# Patient Record
Sex: Male | Born: 1950 | Race: White | Hispanic: No | Marital: Married | Smoking: Never smoker
Health system: Southern US, Community
[De-identification: ages and names within clinical notes are randomized; demographics above are authoritative.]

## PROBLEM LIST (undated history)

## (undated) ENCOUNTER — Emergency Department (HOSPITAL_COMMUNITY): Disposition: A | Discharge status: Home / Self Care | Payer: Medicare Other | Source: Ambulatory Visit

## (undated) DIAGNOSIS — H269 Unspecified cataract: Secondary | ICD-10-CM

## (undated) DIAGNOSIS — R55 Syncope and collapse: Secondary | ICD-10-CM

## (undated) DIAGNOSIS — T7840XA Allergy, unspecified, initial encounter: Secondary | ICD-10-CM

## (undated) DIAGNOSIS — G709 Myoneural disorder, unspecified: Secondary | ICD-10-CM

## (undated) DIAGNOSIS — I10 Essential (primary) hypertension: Secondary | ICD-10-CM

## (undated) DIAGNOSIS — N320 Bladder-neck obstruction: Secondary | ICD-10-CM

## (undated) DIAGNOSIS — E785 Hyperlipidemia, unspecified: Secondary | ICD-10-CM

## (undated) DIAGNOSIS — Z8719 Personal history of other diseases of the digestive system: Secondary | ICD-10-CM

## (undated) DIAGNOSIS — G4730 Sleep apnea, unspecified: Secondary | ICD-10-CM

## (undated) DIAGNOSIS — D569 Thalassemia, unspecified: Secondary | ICD-10-CM

## (undated) DIAGNOSIS — Z8669 Personal history of other diseases of the nervous system and sense organs: Secondary | ICD-10-CM

## (undated) DIAGNOSIS — T8859XA Other complications of anesthesia, initial encounter: Secondary | ICD-10-CM

## (undated) DIAGNOSIS — D563 Thalassemia minor: Secondary | ICD-10-CM

## (undated) HISTORY — DX: Essential (primary) hypertension: I10

## (undated) HISTORY — DX: Hyperlipidemia, unspecified: E78.5

## (undated) HISTORY — DX: Syncope and collapse: R55

## (undated) HISTORY — DX: Unspecified cataract: H26.9

## (undated) HISTORY — DX: Thalassemia minor: D56.3

## (undated) HISTORY — DX: Sleep apnea, unspecified: G47.30

## (undated) HISTORY — DX: Myoneural disorder, unspecified: G70.9

## (undated) HISTORY — PX: UVULOPALATOPHARYNGOPLASTY, TONSILLECTOMY AND SEPTOPLASTY: SHX2632

## (undated) HISTORY — PX: ACHILLES TENDON REPAIR: SUR1153

## (undated) HISTORY — DX: Allergy, unspecified, initial encounter: T78.40XA

## (undated) HISTORY — PX: OTHER SURGICAL HISTORY: SHX169

## (undated) HISTORY — DX: Thalassemia, unspecified: D56.9

---

## 1998-09-07 ENCOUNTER — Ambulatory Visit: Admission: RE | Admit: 1998-09-07 | Discharge: 1998-09-07 | Payer: Self-pay | Admitting: Internal Medicine

## 1999-10-25 ENCOUNTER — Encounter (INDEPENDENT_AMBULATORY_CARE_PROVIDER_SITE_OTHER): Payer: Self-pay | Admitting: *Deleted

## 1999-10-25 ENCOUNTER — Ambulatory Visit (HOSPITAL_BASED_OUTPATIENT_CLINIC_OR_DEPARTMENT_OTHER): Admission: RE | Admit: 1999-10-25 | Discharge: 1999-10-26 | Payer: Self-pay | Admitting: Otolaryngology

## 2002-07-13 ENCOUNTER — Ambulatory Visit (HOSPITAL_COMMUNITY): Admission: RE | Admit: 2002-07-13 | Discharge: 2002-07-13 | Payer: Self-pay | Admitting: Orthopedic Surgery

## 2002-07-13 ENCOUNTER — Encounter: Payer: Self-pay | Admitting: Internal Medicine

## 2004-01-06 ENCOUNTER — Ambulatory Visit: Payer: Self-pay | Admitting: Internal Medicine

## 2004-01-12 ENCOUNTER — Ambulatory Visit: Payer: Self-pay | Admitting: Internal Medicine

## 2004-04-30 ENCOUNTER — Ambulatory Visit: Payer: Self-pay | Admitting: Internal Medicine

## 2005-02-07 ENCOUNTER — Ambulatory Visit: Payer: Self-pay | Admitting: Internal Medicine

## 2005-02-14 ENCOUNTER — Ambulatory Visit: Payer: Self-pay | Admitting: Internal Medicine

## 2006-01-14 ENCOUNTER — Ambulatory Visit: Payer: Self-pay | Admitting: Internal Medicine

## 2006-03-28 ENCOUNTER — Ambulatory Visit: Payer: Self-pay | Admitting: Internal Medicine

## 2006-03-28 LAB — CONVERTED CEMR LAB
ALT: 33 units/L (ref 0–40)
Alkaline Phosphatase: 55 units/L (ref 39–117)
Basophils Absolute: 0 10*3/uL (ref 0.0–0.1)
Basophils Relative: 0.5 % (ref 0.0–1.0)
GFR calc Af Amer: 81 mL/min
GFR calc non Af Amer: 67 mL/min
Glucose, Bld: 98 mg/dL (ref 70–99)
Leukocytes, UA: NEGATIVE
MCV: 63.9 fL — ABNORMAL LOW (ref 78.0–100.0)
Monocytes Absolute: 0.5 10*3/uL (ref 0.2–0.7)
Monocytes Relative: 10.3 % (ref 3.0–11.0)
Nitrite: NEGATIVE
Platelets: 315 10*3/uL (ref 150–400)
Potassium: 4.1 meq/L (ref 3.5–5.1)
RBC: 6.16 M/uL — ABNORMAL HIGH (ref 4.22–5.81)
Sodium: 140 meq/L (ref 135–145)
Specific Gravity, Urine: 1.025 (ref 1.000–1.03)
Total Protein, Urine: NEGATIVE mg/dL
Total Protein: 7.1 g/dL (ref 6.0–8.3)
Urobilinogen, UA: 0.2 (ref 0.0–1.0)
VLDL: 33 mg/dL (ref 0–40)
WBC: 4.7 10*3/uL (ref 4.5–10.5)
pH: 6 (ref 5.0–8.0)

## 2006-04-07 ENCOUNTER — Ambulatory Visit: Payer: Self-pay | Admitting: Internal Medicine

## 2006-04-10 ENCOUNTER — Ambulatory Visit: Payer: Self-pay | Admitting: Internal Medicine

## 2006-04-10 HISTORY — PX: COLONOSCOPY: SHX174

## 2006-07-22 ENCOUNTER — Ambulatory Visit: Payer: Self-pay | Admitting: Internal Medicine

## 2007-04-07 ENCOUNTER — Encounter: Payer: Self-pay | Admitting: Internal Medicine

## 2007-04-07 DIAGNOSIS — I10 Essential (primary) hypertension: Secondary | ICD-10-CM | POA: Insufficient documentation

## 2007-04-07 DIAGNOSIS — E785 Hyperlipidemia, unspecified: Secondary | ICD-10-CM

## 2007-06-09 ENCOUNTER — Ambulatory Visit: Payer: Self-pay | Admitting: Internal Medicine

## 2007-06-09 LAB — CONVERTED CEMR LAB
ALT: 31 units/L (ref 0–53)
AST: 23 units/L (ref 0–37)
Basophils Relative: 0.8 % (ref 0.0–1.0)
CO2: 31 meq/L (ref 19–32)
Calcium: 9.6 mg/dL (ref 8.4–10.5)
Chloride: 103 meq/L (ref 96–112)
Creatinine, Ser: 1.1 mg/dL (ref 0.4–1.5)
Eosinophils Relative: 1.9 % (ref 0.0–5.0)
Glucose, Bld: 104 mg/dL — ABNORMAL HIGH (ref 70–99)
Hemoglobin, Urine: NEGATIVE
Hemoglobin: 12 g/dL — ABNORMAL LOW (ref 13.0–17.0)
Ketones, ur: NEGATIVE mg/dL
Leukocytes, UA: NEGATIVE
Lymphocytes Relative: 25 % (ref 12.0–46.0)
Monocytes Relative: 6.9 % (ref 3.0–12.0)
Neutro Abs: 3.8 10*3/uL (ref 1.4–7.7)
Neutrophils Relative %: 65.4 % (ref 43.0–77.0)
Nitrite: NEGATIVE
PSA: 0.79 ng/mL (ref 0.10–4.00)
RBC: 6.18 M/uL — ABNORMAL HIGH (ref 4.22–5.81)
Specific Gravity, Urine: 1.015 (ref 1.000–1.03)
TSH: 2.17 microintl units/mL (ref 0.35–5.50)
Total Bilirubin: 1.3 mg/dL — ABNORMAL HIGH (ref 0.3–1.2)
Total CHOL/HDL Ratio: 4.9
Total Protein: 7.5 g/dL (ref 6.0–8.3)
Triglycerides: 119 mg/dL (ref 0–149)
Urobilinogen, UA: 0.2 (ref 0.0–1.0)
WBC: 5.8 10*3/uL (ref 4.5–10.5)
pH: 6.5 (ref 5.0–8.0)

## 2007-06-18 ENCOUNTER — Telehealth: Payer: Self-pay | Admitting: Internal Medicine

## 2007-06-18 ENCOUNTER — Ambulatory Visit: Payer: Self-pay | Admitting: Internal Medicine

## 2007-09-15 ENCOUNTER — Telehealth: Payer: Self-pay | Admitting: Internal Medicine

## 2007-09-17 ENCOUNTER — Encounter: Payer: Self-pay | Admitting: Internal Medicine

## 2007-09-25 ENCOUNTER — Ambulatory Visit: Payer: Self-pay | Admitting: Internal Medicine

## 2007-09-25 LAB — CONVERTED CEMR LAB
ALT: 26 units/L (ref 0–53)
Alkaline Phosphatase: 52 units/L (ref 39–117)
Bilirubin, Direct: 0.1 mg/dL (ref 0.0–0.3)
HDL: 36.5 mg/dL — ABNORMAL LOW (ref >39.0)
Total Bilirubin: 1.3 mg/dL — ABNORMAL HIGH (ref 0.3–1.2)
Total Protein: 7.1 g/dL (ref 6.0–8.3)
VLDL: 47 mg/dL — ABNORMAL HIGH (ref 0–40)

## 2008-07-05 ENCOUNTER — Ambulatory Visit: Payer: Self-pay | Admitting: Internal Medicine

## 2008-07-05 LAB — CONVERTED CEMR LAB
Alkaline Phosphatase: 50 units/L (ref 39–117)
BUN: 18 mg/dL (ref 6–23)
Basophils Relative: 0.1 % (ref 0.0–3.0)
Bilirubin, Direct: 0.2 mg/dL (ref 0.0–0.3)
CO2: 29 meq/L (ref 19–32)
Direct LDL: 193.6 mg/dL
Eosinophils Absolute: 0.1 10*3/uL (ref 0.0–0.7)
GFR calc non Af Amer: 81.78 mL/min (ref >60)
Glucose, Bld: 90 mg/dL (ref 70–99)
HDL: 40.6 mg/dL (ref >39.00)
Hemoglobin: 12 g/dL — ABNORMAL LOW (ref 13.0–17.0)
Lymphocytes Relative: 27.5 % (ref 12.0–46.0)
MCHC: 32.3 g/dL (ref 30.0–36.0)
Monocytes Relative: 9.5 % (ref 3.0–12.0)
Neutrophils Relative %: 60.1 % (ref 43.0–77.0)
Nitrite: NEGATIVE
PSA: 1.33 ng/mL (ref 0.10–4.00)
Potassium: 4.2 meq/L (ref 3.5–5.1)
RBC: 5.78 M/uL (ref 4.22–5.81)
Sodium: 144 meq/L (ref 135–145)
Specific Gravity, Urine: 1.02 (ref 1.000–1.030)
Total Bilirubin: 1.4 mg/dL — ABNORMAL HIGH (ref 0.3–1.2)
Total Protein, Urine: NEGATIVE mg/dL
Urine Glucose: NEGATIVE mg/dL
VLDL: 34 mg/dL (ref 0.0–40.0)
WBC: 5.1 10*3/uL (ref 4.5–10.5)
pH: 6 (ref 5.0–8.0)

## 2008-07-11 ENCOUNTER — Ambulatory Visit: Payer: Self-pay | Admitting: Internal Medicine

## 2008-07-11 DIAGNOSIS — M26609 Unspecified temporomandibular joint disorder, unspecified side: Secondary | ICD-10-CM | POA: Insufficient documentation

## 2008-07-11 DIAGNOSIS — R3919 Other difficulties with micturition: Secondary | ICD-10-CM

## 2008-07-11 DIAGNOSIS — D485 Neoplasm of uncertain behavior of skin: Secondary | ICD-10-CM

## 2009-01-05 ENCOUNTER — Ambulatory Visit: Payer: Self-pay | Admitting: Internal Medicine

## 2009-06-22 ENCOUNTER — Telehealth: Payer: Self-pay | Admitting: Internal Medicine

## 2009-07-06 ENCOUNTER — Ambulatory Visit: Payer: Self-pay | Admitting: Internal Medicine

## 2009-07-06 DIAGNOSIS — M67919 Unspecified disorder of synovium and tendon, unspecified shoulder: Secondary | ICD-10-CM | POA: Insufficient documentation

## 2009-07-06 DIAGNOSIS — M719 Bursopathy, unspecified: Secondary | ICD-10-CM

## 2009-08-02 ENCOUNTER — Ambulatory Visit: Payer: Self-pay | Admitting: Internal Medicine

## 2009-08-02 LAB — CONVERTED CEMR LAB
ALT: 30 units/L (ref 0–53)
AST: 22 units/L (ref 0–37)
Albumin: 4.6 g/dL (ref 3.5–5.2)
Alkaline Phosphatase: 50 units/L (ref 39–117)
Basophils Relative: 0.6 % (ref 0.0–3.0)
Bilirubin Urine: NEGATIVE
Bilirubin, Direct: 0.1 mg/dL (ref 0.0–0.3)
Chloride: 105 meq/L (ref 96–112)
Creatinine, Ser: 1 mg/dL (ref 0.4–1.5)
Eosinophils Absolute: 0.2 10*3/uL (ref 0.0–0.7)
Eosinophils Relative: 3 % (ref 0.0–5.0)
GFR calc non Af Amer: 79.63 mL/min (ref >60)
Hemoglobin, Urine: NEGATIVE
Hemoglobin: 11.4 g/dL — ABNORMAL LOW (ref 13.0–17.0)
Ketones, ur: NEGATIVE mg/dL
Lymphocytes Relative: 27.7 % (ref 12.0–46.0)
MCHC: 32.4 g/dL (ref 30.0–36.0)
Monocytes Relative: 7.6 % (ref 3.0–12.0)
Neutro Abs: 3.4 10*3/uL (ref 1.4–7.7)
Neutrophils Relative %: 61.1 % (ref 43.0–77.0)
Nitrite: NEGATIVE
RBC: 5.57 M/uL (ref 4.22–5.81)
Sodium: 140 meq/L (ref 135–145)
Total Protein, Urine: NEGATIVE mg/dL
Total Protein: 7.1 g/dL (ref 6.0–8.3)
Urine Glucose: NEGATIVE mg/dL
VLDL: 37.8 mg/dL (ref 0.0–40.0)
WBC: 5.6 10*3/uL (ref 4.5–10.5)
pH: 5.5 (ref 5.0–8.0)

## 2009-08-09 ENCOUNTER — Ambulatory Visit: Payer: Self-pay | Admitting: Internal Medicine

## 2009-08-10 ENCOUNTER — Telehealth: Payer: Self-pay | Admitting: Internal Medicine

## 2010-04-10 NOTE — Assessment & Plan Note (Signed)
Summary: per pt via email to men,steriod injection shoulder/cd   Vital Signs:  Patient profile:   60 year old male Height:      71 inches (180.34 cm) Weight:      196.50 pounds (89.32 kg) BMI:     27.51 O2 Sat:      97 % on Room air Temp:     98.6 degrees F (37.00 degrees C) oral Pulse rate:   77 / minute Pulse rhythm:   regular BP sitting:   134 / 78  (left arm) Cuff size:   regular  Vitals Entered By: Brenton Grills (July 06, 2009 4:01 PM)  O2 Flow:  Room air CC: pt here for steroid injection in right shoulder/pt states he has been taking advil for shoulder pain with no relief/aj   Primary Care Provider:  Norins   CC:  pt here for steroid injection in right shoulder/pt states he has been taking advil for shoulder pain with no relief/aj.  History of Present Illness: Patient presents for recurrent right shoulder pain. He was seen several months ago for a similar problem and did well with depomedrol injection. He reports that the pain is similar. He does have good range of motion but is becoming limited in his activities due to discomfort.   Current Medications (verified): 1)  Enalapril Maleate 10 Mg  Tabs (Enalapril Maleate) .... Take 1 By Mouth Qd 2)  Adult Aspirin Ec Low Strength 81 Mg  Tbec (Aspirin) .... Take1 By Mouth Qd 3)  Multivitamins   Tabs (Multiple Vitamin) .Marland Kitchen.. 1 Tab Daily 4)  Coq10 100 Mg  Caps (Coenzyme Q10) .Marland Kitchen.. 1 Tab Daily 5)  Fish Oil 1000 Mg  Caps (Omega-3 Fatty Acids) .Marland Kitchen.. 1 Tablet Daily 6)  Vitamin C 1000 Mg  Tabs (Ascorbic Acid) .Marland Kitchen.. 1 Tablet Daily 7)  Vitamin B-12 Cr 1000 Mcg  Tbcr (Cyanocobalamin) .Marland Kitchen.. 1 Tablet Daily 8)  Calcium-Magnesium 500-250 Mg Tabs (Calcium-Magnesium) .... Take 1 Tablet By Mouth Once A Day 9)  Glucosamine-Chondroitin   Caps (Glucosamine-Chondroit-Vit C-Mn) .... Take 1 Tablet By Mouth Once A Day 10)  Zolpidem Tartrate 10 Mg Tabs (Zolpidem Tartrate) .Marland Kitchen.. 1 By Mouth At Bedtime As Needed 11)  Desonide 0.05 % Lotn (Desonide) ....  Apply Three Times A Day As Needed To Poison Ivey  Allergies (verified): 1)  ! Septra PMH-FH-SH reviewed-no changes except otherwise noted  Review of Systems  The patient denies anorexia, weight loss, hoarseness, syncope, prolonged cough, abdominal pain, difficulty walking, and enlarged lymph nodes.    Physical Exam  General:  WNWD white male in no distress Msk:  right should without deformity. Normal active and passive ROM. No crepitis in the joint, no click.   Impression & Recommendations:  Problem # 1:  BURSITIS, RIGHT SHOULDER (ICD-726.10)  Typical presentation. No evidence of joint derangement.  Plan - steroid injection - done with good tolerance and relief of symptoms.  Orders: Joint Aspirate / Injection, Large (20610) Depo- Medrol 40mg  (J1030)  Complete Medication List: 1)  Enalapril Maleate 10 Mg Tabs (Enalapril maleate) .... Take 1 by mouth qd 2)  Adult Aspirin Ec Low Strength 81 Mg Tbec (Aspirin) .... Take1 by mouth qd 3)  Multivitamins Tabs (Multiple vitamin) .Marland Kitchen.. 1 tab daily 4)  Coq10 100 Mg Caps (Coenzyme q10) .Marland Kitchen.. 1 tab daily 5)  Fish Oil 1000 Mg Caps (Omega-3 fatty acids) .Marland Kitchen.. 1 tablet daily 6)  Vitamin C 1000 Mg Tabs (Ascorbic acid) .Marland Kitchen.. 1 tablet daily 7)  Vitamin B-12 Cr  1000 Mcg Tbcr (Cyanocobalamin) .Marland Kitchen.. 1 tablet daily 8)  Calcium-magnesium 500-250 Mg Tabs (Calcium-magnesium) .... Take 1 tablet by mouth once a day 9)  Glucosamine-chondroitin Caps (Glucosamine-chondroit-vit c-mn) .... Take 1 tablet by mouth once a day 10)  Zolpidem Tartrate 10 Mg Tabs (Zolpidem tartrate) .Marland Kitchen.. 1 by mouth at bedtime as needed 11)  Desonide 0.05 % Lotn (Desonide) .... Apply three times a day as needed to poison ivey   Procedure Note Last Tetanus: Td (05/19/2001)  Injections: The patient complains of pain. Indication: acute pain  Procedure # 1: joint injection    Location: right shoulder    Technique: #23g 1" needle    Medication: 40 mg depomedrol    Anesthesia: 2%  xylocain w/ epi    Comment: verbal constnt obtained from the patient. Lateral approach to the shoulder joint. Entered joint space on the first pass. Patient with relief within 2-3 mintues.   Cleaned and prepped with: betadine Additional Instructions: routine post-injection instructions

## 2010-04-10 NOTE — Assessment & Plan Note (Signed)
Summary: cpx / [facility] / # cd   Vital Signs:  Patient profile:   60 year old male Height:      71 inches Weight:      191 pounds BMI:     26.74 O2 Sat:      99 % on Room air Temp:     98.3 degrees F oral Pulse rate:   72 / minute BP sitting:   112 / 74  (left arm) Cuff size:   regular  Vitals Entered By: Ami Bullins CMA (August 09, 2009 11:04 AM)  O2 Flow:  Room air CC: pt here for cpx, he is requesting a 90 day supply of Zolpidem/ ab  Vision Screening:      Vision Comments: Last Eye Exam about 2 years ago   Primary Care Provider:  Rushton Early   CC:  pt here for cpx and he is requesting a 90 day supply of Zolpidem/ ab.  History of Present Illness: Right shoulder doing OK since steroid injection.  He will have mild dizziness/light-headed feeling especially with position change. Onset of symptom was several days ago with no precipitating factors. No focal neurologic signs. Episodes are transient. No falls or limitations in activity.  Check moles on flank.  Generally doing well without other complaints. He did have a LipoProfile with high count small LDL-P - provided patient with copy of report as well as chpt from up-to-date on lipid indicators. He does have a very high total cholesterol and LDL. He has tried statin drugs in the past with complications of muscle pain. He prefers to not take medication.   Current Medications (verified): 1)  Enalapril Maleate 10 Mg  Tabs (Enalapril Maleate) .... Take 1 By Mouth Qd 2)  Adult Aspirin Ec Low Strength 81 Mg  Tbec (Aspirin) .... Take1 By Mouth Qd 3)  Multivitamins   Tabs (Multiple Vitamin) .Marland Kitchen.. 1 Tab Daily 4)  Coq10 100 Mg  Caps (Coenzyme Q10) .Marland Kitchen.. 1 Tab Daily 5)  Fish Oil 1000 Mg  Caps (Omega-3 Fatty Acids) .Marland Kitchen.. 1 Tablet Daily 6)  Vitamin C 1000 Mg  Tabs (Ascorbic Acid) .Marland Kitchen.. 1 Tablet Daily 7)  Vitamin B-12 Cr 1000 Mcg  Tbcr (Cyanocobalamin) .Marland Kitchen.. 1 Tablet Daily 8)  Calcium-Magnesium 500-250 Mg Tabs (Calcium-Magnesium) .... Take 1  Tablet By Mouth Once A Day 9)  Glucosamine-Chondroitin   Caps (Glucosamine-Chondroit-Vit C-Mn) .... Take 1 Tablet By Mouth Once A Day 10)  Zolpidem Tartrate 10 Mg Tabs (Zolpidem Tartrate) .Marland Kitchen.. 1 By Mouth At Bedtime As Needed 11)  Desonide 0.05 % Lotn (Desonide) .... Apply Three Times A Day As Needed To Poison Ivey  Allergies (verified): 1)  ! Septra  Past History:  Past Medical History: Last updated: 06/30/2007 Hyperlipidemia Hypertension Thalassemia minor Episode of near syncope with extensive cardiac evalution-nuc stress '01  Past Surgical History: Last updated: June 30, 2007 Septoplasty and submuscosal resection of inferior turbinates with uvulopalatopharynoplasty with tonsillectomy '01 Jearld Fenton)  Family History: Last updated: 06/30/2007 father-deceased @ 75: CAD/MI-fatal, HTN, smoker mother-'22: manic-depressive, physically a rock Neg- colon cancer, DM  Social History: Last updated: 08/09/2009 [employer] business; [employer] married '75 1 son '89; 2 daughters - '81, '84;  work: self-employed. got a great price on a '99 mercedes sl 500 convertible. (June '11)  Risk Factors: Alcohol Use: 1 (07/11/2008) Caffeine Use: 2 coffee (07/11/2008) Diet: healthy (07/11/2008) Exercise: yes (07/11/2008)  Risk Factors: Smoking Status: never (07/11/2008)  Social History: [employer] business; [employer] married '75 1 son '  62; 2 daughters - '81, '84;  work: self-employed. got a great price on a '99 mercedes sl 500 convertible. (June '11)  Review of Systems  The patient denies anorexia, fever, weight loss, weight gain, decreased hearing, hoarseness, chest pain, dyspnea on exertion, peripheral edema, prolonged cough, hemoptysis, abdominal pain, severe indigestion/heartburn, incontinence, muscle weakness, difficulty walking, depression, abnormal bleeding, enlarged lymph nodes, and angioedema.    Physical Exam  General:  WNWD white male in no  distress Head:  Normocephalic and atraumatic without obvious abnormalities. No apparent alopecia or balding. Eyes:  No corneal or conjunctival inflammation noted. EOMI. Perrla. Funduscopic exam benign, without hemorrhages, exudates or papilledema. Vision grossly normal. Ears:  External ear exam shows no significant lesions or deformities.  Otoscopic examination reveals clear canals, tympanic membranes are intact bilaterally without bulging, retraction, inflammation or discharge. Hearing is grossly normal bilaterally. Nose:  no external deformity and no external erythema.   Mouth:  Oral mucosa and oropharynx without lesions or exudates.  Teeth in good repair. Neck:  supple, full ROM, no thyromegaly, and no carotid bruits.   Chest Wall:  no deformities and no tenderness.   Lungs:  Normal respiratory effort, chest expands symmetrically. Lungs are clear to auscultation, no crackles or wheezes. Heart:  Normal rate and regular rhythm. S1 and S2 normal without gallop, murmur, click, rub or other extra sounds. Abdomen:  soft, non-tender, normal bowel sounds, and no hepatomegaly.   Rectal:  No external abnormalities noted. Normal sphincter tone. No rectal masses or tenderness. Prostate:  Prostate gland firm and smooth, no enlargement, nodularity, tenderness, mass, asymmetry or induration. Msk:  normal ROM, no joint swelling, and no joint deformities.   Pulses:  2+ radial and DP pulses Extremities:  No clubbing, cyanosis, edema, or deformity noted with normal full range of motion of all joints.   Neurologic:  alert & oriented X3, cranial nerves II-XII intact, strength normal in all extremities, gait normal, and DTRs symmetrical and normal.   Skin:  turgor normal, color normal, no rashes, and no ulcerations.  Multiple benign appearing moles on the back. Keratotic lesion 1 x 3 cm right axillary line. Ecchymosis medial aspect proximal left UE Cervical Nodes:  no anterior cervical adenopathy and no posterior  cervical adenopathy.   Inguinal Nodes:  no R inguinal adenopathy and no L inguinal adenopathy.   Psych:  Oriented X3, memory intact for recent and remote, normally interactive, good eye contact, and not anxious appearing.     Impression & Recommendations:  Problem # 1:  BURSITIS, RIGHT SHOULDER (ICD-726.10) Good response to cortisone injection - has full use of right shoulder.  Problem # 2:  HYPERTENSION (ICD-401.9)  His updated medication list for this problem includes:    Enalapril Maleate 10 Mg Tabs (Enalapril maleate) .Marland Kitchen... Take 1 by mouth qd  BP today: 112/74 Prior BP: 134/78 (07/06/2009)  Labs Reviewed: K+: 4.7 (08/02/2009) Creat: : 1.0 (08/02/2009)    Great control - will continue present medications.  Problem # 3:  HYPERLIPIDEMIA (ICD-272.4)  Labs Reviewed: SGOT: 22 (08/02/2009)   SGPT: 30 (08/02/2009)   HDL:47.00 (08/02/2009), 40.60 (07/05/2008)  LDL:220.1 (08/02/2009), 131 (06/09/2007)  Chol:306 (08/02/2009), 279 (07/05/2008)  Trig:189.0 (08/02/2009), 170.0 (07/05/2008)  Went over NMR lipoprofile - the high count on small particle LDL may offset the risk of the very high total LDL  Plan - diet managmement           he may need to consider non-statin medical therapy for his very high LDL  Problem # 4:  Preventive Health Care (ICD-V70.0) Unremarkable history except for bursitis. PHysical exam is normal. Lab results at normal except for lipid panel. He is current with colorectal cancer screening and prostate cancer screening.   In summary - a very nice man who appears to be medically stable except for hypeerlipidemia.   Complete Medication List: 1)  Enalapril Maleate 10 Mg Tabs (Enalapril maleate) .... Take 1 by mouth qd 2)  Adult Aspirin Ec Low Strength 81 Mg Tbec (Aspirin) .... Take1 by mouth qd 3)  Multivitamins Tabs (Multiple vitamin) .Marland Kitchen.. 1 tab daily 4)  Coq10 100 Mg Caps (Coenzyme q10) .Marland Kitchen.. 1 tab daily 5)  Fish Oil 1000 Mg Caps (Omega-3 fatty acids) .Marland Kitchen.. 1  tablet daily 6)  Vitamin C 1000 Mg Tabs (Ascorbic acid) .Marland Kitchen.. 1 tablet daily 7)  Vitamin B-12 Cr 1000 Mcg Tbcr (Cyanocobalamin) .Marland Kitchen.. 1 tablet daily 8)  Calcium-magnesium 500-250 Mg Tabs (Calcium-magnesium) .... Take 1 tablet by mouth once a day 9)  Glucosamine-chondroitin Caps (Glucosamine-chondroit-vit c-mn) .... Take 1 tablet by mouth once a day 10)  Zolpidem Tartrate 10 Mg Tabs (Zolpidem tartrate) .Marland Kitchen.. 1 by mouth at bedtime as needed 11)  Desonide 0.05 % Lotn (Desonide) .... Apply three times a day as needed to poison ivey  Patient: Zachary Gallegos Note: All result statuses are Final unless otherwise noted.  Tests: (1) BMP (METABOL)   Sodium                    140 mEq/L                   135-145   Potassium                 4.7 mEq/L                   3.5-5.1   Chloride                  105 mEq/L                   96-112   Carbon Dioxide            28 mEq/L                    19-32   Glucose                   83 mg/dL                    04-54   BUN                  [H]  24 mg/dL                    0-98   Creatinine                1.0 mg/dL                   1.1-9.1   Calcium                   9.6 mg/dL                   4.7-82.9   GFR                       79.63 mL/min                >  60  Tests: (2) Lipid Panel (LIPID)   Cholesterol          [H]  306 mg/dL                   1-610     ATP III Classification            Desirable:  < 200 mg/dL                    Borderline High:  200 - 239 mg/dL               High:  > = 240 mg/dL   Triglycerides        [H]  189.0 mg/dL                 9.6-045.4     Normal:  <150 mg/dL     Borderline High:  098 - 199 mg/dL   HDL                       11.91 mg/dL                 >47.82   VLDL Cholesterol          37.8 mg/dL                  9.5-62.1  CHO/HDL Ratio:  CHD Risk                             7                    Men          Women     1/2 Average Risk     3.4          3.3     Average Risk          5.0          4.4     2X Average Risk           9.6          7.1     3X Average Risk          15.0          11.0                           Tests: (3) CBC Platelet w/Diff (CBCD)   White Cell Count          5.6 K/uL                    4.5-10.5   Red Cell Count            5.57 Mil/uL                 4.22-5.81   Hemoglobin           [L]  11.4 g/dL                   30.8-65.7   Hematocrit           [L]  35.4 %                      39.0-52.0   MCV                  [  L]  63.5 fl                     78.0-100.0     Rechecked and verified result.   MCHC                      32.4 g/dL                   04.5-40.9   RDW                  [H]  15.9 %                      11.5-14.6   Platelet Count            209.0 K/uL                  150.0-400.0   Neutrophil %              61.1 %                      43.0-77.0   Lymphocyte %              27.7 %                      12.0-46.0   Monocyte %                7.6 %                       3.0-12.0   Eosinophils%              3.0 %                       0.0-5.0   Basophils %               0.6 %                       0.0-3.0   Neutrophill Absolute      3.4 K/uL                    1.4-7.7   Lymphocyte Absolute       1.5 K/uL                    0.7-4.0   Monocyte Absolute         0.4 K/uL                    0.1-1.0  Eosinophils, Absolute                             0.2 K/uL                    0.0-0.7   Basophils Absolute        0.0 K/uL                    0.0-0.1  Tests: (4) Hepatic/Liver Function Panel (HEPATIC)   Total Bilirubin           0.9 mg/dL                   8.1-1.9   Direct Bilirubin  0.1 mg/dL                   0.4-5.4   Alkaline Phosphatase      50 U/L                      39-117   AST                       22 U/L                      0-37   ALT                       30 U/L                      0-53   Total Protein             7.1 g/dL                    0.9-8.1   Albumin                   4.6 g/dL                    1.9-1.4  Tests: (5) TSH (TSH)   FastTSH                    1.31 uIU/mL                 0.35-5.50  Tests: (6) Prostate Specific Antigen (PSA)   PSA-Hyb                   1.12 ng/mL                  0.10-4.00  Tests: (7) UDip Only (UDIP)   Color                     Yellow       RANGE:  Yellow;Lt. Yellow   Clarity                   CLEAR                       Clear   Specific Gravity          1.020                       1.000 - 1.030   Urine Ph                  5.5                         5.0-8.0   Protein                   NEGATIVE                    Negative   Urine Glucose             NEGATIVE                    Negative   Ketones                   NEGATIVE  Negative   Urine Bilirubin           NEGATIVE                    Negative   Blood                     NEGATIVE                    Negative   Urobilinogen              0.2                         0.0 - 1.0   Leukocyte Esterace        NEGATIVE                    Negative   Nitrite                   NEGATIVE                    Negative  Tests: (8) Cholesterol LDL - Direct (DIRLDL)  Cholesterol LDL - Direct                             220.3 mg/dL     Optimal:  <644 mg/dL     Near or Above Optimal:  100-129 mg/dL     Borderline High:  034-742 mg/dL     High:  595-638 mg/dL     Very High:  >756 mg/dL

## 2010-04-10 NOTE — Progress Notes (Signed)
Summary: Refill request  Phone Note Refill Request Message from:  Patient on August 10, 2009 2:57 PM  Refills Requested: Medication #1:  ZOLPIDEM TARTRATE 10 MG TABS 1 by mouth at bedtime as needed   Dosage confirmed as above?Dosage Confirmed Please advise refill? 90 day supply sent to [facility] on [address].   Initial call taken by: Josph Macho RMA,  August 10, 2009 2:58 PM  Follow-up for Phone Call        ok for refill x 90 days Follow-up by: Jacques Navy MD,  August 10, 2009 4:48 PM    Prescriptions: ZOLPIDEM TARTRATE 10 MG TABS (ZOLPIDEM TARTRATE) 1 by mouth at bedtime as needed  #90 x 1   Entered and Authorized by:   Josph Macho RMA   Signed by:   Josph Macho RMA on 08/11/2009   Method used:   Telephoned to ...       [facility]  [address]  [phone]* (retail)       [address]       [place], [place]  [address]       Ph: [phone] or [phone]       Fax: [phone]   RxID:   [id number]

## 2010-04-10 NOTE — Progress Notes (Signed)
  Phone Note Call from Patient Call back at Home Phone [phone]   Caller: Patient Call For: Jacques Navy MD Summary of Call: Pt sched CPX for 5/24 with Dr Debby Bud. Pt will be out of bp medicine in 1 wk and is requesting a 90 day supply be sent to [facility] on [address], [place] and it will only cost $10. Please advise and let pt know if prescription is sent to pharmacy. Initial call taken by: Verdell Face,  June 22, 2009 12:23 PM  Follow-up for Phone Call        OK for Rx for 90 days.  Follow-up by: Jacques Navy MD,  June 22, 2009 1:13 PM    Prescriptions: ENALAPRIL MALEATE 10 MG  TABS (ENALAPRIL MALEATE) take 1 by mouth qd  #90 x 3   Entered by:   Ami Bullins CMA   Authorized by:   Jacques Navy MD   Signed by:   Bill Salinas CMA on 06/22/2009   Method used:   Electronically to        [facility]  [phone]* (retail)       [address]       [place], [place]  [address]       Ph: [phone] or [phone]       Fax: [phone]   RxID:   [id number]

## 2010-04-12 ENCOUNTER — Encounter: Payer: Self-pay | Admitting: Internal Medicine

## 2010-04-12 ENCOUNTER — Ambulatory Visit (INDEPENDENT_AMBULATORY_CARE_PROVIDER_SITE_OTHER): Payer: No Typology Code available for payment source | Admitting: Internal Medicine

## 2010-04-12 DIAGNOSIS — M67919 Unspecified disorder of synovium and tendon, unspecified shoulder: Secondary | ICD-10-CM

## 2010-04-18 NOTE — Assessment & Plan Note (Signed)
Summary: SHOULDER PAIN  STC   Vital Signs:  Patient profile:   60 year old male Height:      71 inches Weight:      197 pounds BMI:     27.58 O2 Sat:      97 % on Room air Temp:     97.7 degrees F oral Pulse rate:   76 / minute BP sitting:   112 / 78  (left arm) Cuff size:   regular  Vitals Entered By: Bill Salinas CMA (April 12, 2010 9:36 AM)  O2 Flow:  Room air CC: pt here with c/o pain in his right shoulder since november/ ab   Primary Care Provider:  Norins   CC:  pt here with c/o pain in his right shoulder since november/ ab.  History of Present Illness: Mr Proto presents for recurrent pain in the right shoulder. He did do some heavier than usual work and since then, november, has had increased pain since that time. He does have fairly well preserved ROM.  Current Medications (verified): 1)  Enalapril Maleate 10 Mg  Tabs (Enalapril Maleate) .... Take 1 By Mouth Qd 2)  Adult Aspirin Ec Low Strength 81 Mg  Tbec (Aspirin) .... Take1 By Mouth Qd 3)  Multivitamins   Tabs (Multiple Vitamin) .Marland Kitchen.. 1 Tab Daily 4)  Coq10 100 Mg  Caps (Coenzyme Q10) .Marland Kitchen.. 1 Tab Daily 5)  Fish Oil 1000 Mg  Caps (Omega-3 Fatty Acids) .Marland Kitchen.. 1 Tablet Daily 6)  Vitamin C 1000 Mg  Tabs (Ascorbic Acid) .Marland Kitchen.. 1 Tablet Daily 7)  Vitamin B-12 Cr 1000 Mcg  Tbcr (Cyanocobalamin) .Marland Kitchen.. 1 Tablet Daily 8)  Calcium-Magnesium 500-250 Mg Tabs (Calcium-Magnesium) .... Take 1 Tablet By Mouth Once A Day 9)  Glucosamine-Chondroitin   Caps (Glucosamine-Chondroit-Vit C-Mn) .... Take 1 Tablet By Mouth Once A Day 10)  Zolpidem Tartrate 10 Mg Tabs (Zolpidem Tartrate) .Marland Kitchen.. 1 By Mouth At Bedtime As Needed 11)  Desonide 0.05 % Lotn (Desonide) .... Apply Three Times A Day As Needed To Poison Ivey  Allergies (verified): 1)  ! Septra  Past History:  Past Medical History: Last updated: 06/22/2007 Hyperlipidemia Hypertension Thalassemia minor Episode of near syncope with extensive cardiac evalution-nuc stress '01  Past  Surgical History: Last updated: Jun 22, 2007 Septoplasty and submuscosal resection of inferior turbinates with uvulopalatopharynoplasty with tonsillectomy '01 Jearld Fenton)  Family History: Last updated: 06/22/2007 father-deceased @ 75: CAD/MI-fatal, HTN, smoker mother-'22: manic-depressive, physically a rock Neg- colon cancer, DM  Social History: [employer] business; [employer] married '75 1 son '89; 2 daughters - '81, '84;  work: self-employed. got a great price on a '99 mercedes sl 500 convertible. 24-Aug-2022) Mother-in-law recently died. Family is making appropriate adjustment.   Review of Systems  The patient denies anorexia, fever, weight loss, chest pain, dyspnea on exertion, abdominal pain, muscle weakness, and enlarged lymph nodes.    Physical Exam  General:  Well-developed,well-nourished,in no acute distress; alert,appropriate and cooperative throughout examination Eyes:  C&S clear Neck:  supple.   Lungs:  normal respiratory effort.   Heart:  normal rate and regular rhythm.   Msk:  Normal ROM right shoulder with some pain at the extremes of motion.    Complete Medication List: 1)  Enalapril Maleate 10 Mg Tabs (Enalapril maleate) .... Take 1 by mouth qd 2)  Adult Aspirin Ec Low Strength 81 Mg Tbec (Aspirin) .... Take1 by mouth qd 3)  Multivitamins Tabs (Multiple vitamin) .Marland Kitchen.. 1 tab daily 4)  Coq10  100 Mg Caps (Coenzyme q10) .Marland Kitchen.. 1 tab daily 5)  Fish Oil 1000 Mg Caps (Omega-3 fatty acids) .Marland Kitchen.. 1 tablet daily 6)  Vitamin C 1000 Mg Tabs (Ascorbic acid) .Marland Kitchen.. 1 tablet daily 7)  Vitamin B-12 Cr 1000 Mcg Tbcr (Cyanocobalamin) .Marland Kitchen.. 1 tablet daily 8)  Calcium-magnesium 500-250 Mg Tabs (Calcium-magnesium) .... Take 1 tablet by mouth once a day 9)  Glucosamine-chondroitin Caps (Glucosamine-chondroit-vit c-mn) .... Take 1 tablet by mouth once a day 10)  Zolpidem Tartrate 10 Mg Tabs (Zolpidem tartrate) .Marland Kitchen.. 1 by mouth at bedtime as needed 11)  Desonide 0.05 % Lotn  (Desonide) .... Apply three times a day as needed to poison ivey  Other Orders: Joint Aspirate / Injection, Large (20610) Depo- Medrol 40mg  (J1030)   Orders Added: 1)  Est. Patient Level II [36644] 2)  Joint Aspirate / Injection, Large [20610] 3)  Depo- Medrol 40mg  [J1030]     Procedure Note Last Tetanus: Td (05/19/2001)  Injections: The patient complains of pain and inflammation. Indication: acute pain  Procedure # 1: joint injection    Location: right shoulder    Technique: #23g 1" needle    Medication: 40 mg depomedrol    Anesthesia: 2%xylo w/ epi  Cleaned and prepped with: betadine Wound dressing: bandaid

## 2010-06-12 ENCOUNTER — Other Ambulatory Visit: Payer: Self-pay | Admitting: Internal Medicine

## 2010-07-27 NOTE — Assessment & Plan Note (Signed)
[facility]                           PRIMARY CARE OFFICE NOTE   NAME:Grattan, KALIEB                    MRN:          [id number]  DATE:04/07/2006                            DOB:          Dec 03, 1950    Mr. Zachary Gallegos is a 60 year old Caucasian gentleman followed for  hypertension and hyperlipidemia who presents for followup evaluation and  exam. He was last seen in the office January 14, 2006 in regards to a  trial on medicine and prevention. In the interval, the patient reports  that he is generally doing well. He has had some increasing problems  with peripheral neuropathy with tingling and discomfort, worse at night,  disturbing his sleep 1 or 2 night a week. At this point, he is on no  medication and prefers to keep it that way.   The patient does have some mild sleep disturbance, mostly with sleep  duration insomnia, which we discussed including principles of sleep  hygiene.   PAST MEDICAL HISTORY:  SURGICAL:  Septoplasty and submucosal resection  of inferior turbinates with uvulopalatopharyngoplasty with tonsillectomy  by Dr. Suzanna Obey.   MEDICAL:  1. Usual childhood diseases.  2. Mumps as an adult.  3. Hypertension.  4. Mild hyperlipidemia.  5. Thalassemia minor.  6. Episode of near syncope with extensive cardiac evaluation.   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 He reports that he is doing relatively well  and is on schedule in regards to his business plan. His marriage is in  good health. He has 3 children; both daughters are out of college - one  is going to [place] for Spanish immersion and one is teaching in preschool  and living at home. His son is in college in [place] and doing well.   CURRENT MEDICATIONS:  1. Enalapril 10 mg daily.  2. Lipitor 20 mg daily.  3. Baby aspirin 81 mg daily.  4. Vitamin B.  5. Calcium.  6. Fish oil.  7. Multivitamin.  8.  Vitamin C.  9. Coenzyme Q10.  10.Glucosamine.   CHART REVIEW:  The patient is due for colonoscopy, and this is scheduled  for Thursday, January 31.   Last Cardiolite study April 06, 1999 which was unremarkable.   Surgery for obstructive sleep apnea in 2001 as noted. 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   REVIEW OF SYSTEMS:  The patient has had some sleep duration insomnia as  noted. He has not had an eye exam in the last 13 months. No ENT,  cardiovascular, or respiratory complaints except for some post-nasal  drainage. He has rare heartburn but otherwise GI is stable. No GU  problems except for nocturia x2-3. His awakening is prompted by insomnia  and not by a need to micturate. No musculoskeletal or dermatologic  problems. Neurological is positive for peripheral neuropathy as noted.   PHYSICAL EXAMINATION:  Temperature was 98.2, blood pressure 129/79,  pulse 85, weight 195.  GENERAL APPEARANCE:  Well-developed, well-nourished gentleman in no  acute distress.  HEENT:  Normocephalic, atraumatic. EACs and TMs were unremarkable.  Oropharynx with native dentition in good repair. No buccal or palatal  lesions were noted. Posterior pharynx was clear. Conjunctivae and  sclerae were clear. PERRLA. EOMI. Funduscopic exam was unremarkable.  NECK:  Was supple with no thyromegaly.  NODES:  No adenopathy was noted in the cervical or supraclavicular  regions.  CHEST:  No CVA tenderness.  LUNGS:  Clear to auscultation and percussion.  CARDIOVASCULAR:  2+ radial pulses. No JVD or carotid bruits. He had a  quiet precordium with a regular rate and rhythm without murmurs, rubs,  or gallops.  ABDOMEN:  Soft. No guarding or rebound. No organosplenomegaly was noted.  GENITALIA:  Normal male phallus. Bilaterally distended testicles without  masses. No inguinal hernia was noted.  RECTAL:  Normal sphincter tone was noted. Prostate was smooth with   normal size and contour without nodules.  EXTREMITIES:  Without clubbing, cyanosis, edema or deformity.  NEUROLOGICAL EXAM:  The patient is awake, alert and oriented to person,  place, time and context. Cranial nerves II-XII were grossly intact. No  further examination was conducted.   LABORATORY DATA:  Hemoglobin was 12.3 g. White count was 4700 with a  normal differential. He does have 2+ microcytosis and 2+ hyperchromatic  change consistent with thalassemia minor. Cholesterol 189, triglycerides  163, HDL 42.6, LDL was 114. Chemistries were unremarkable with a glucose  of 98. Electrolytes were normal. Kidney function normal with a  creatinine of 1.2 and a GFR of 67 mL per minute. Liver functions were  normal except for minimally elevated total bilirubin of 1.4. Thyroid  function normal with a TSH of 1.73. PSA was normal at 0.87.  Twelve-lead  electrocardiogram revealed normal sinus rhythm, no specific signs of  injury or abnormality.   ASSESSMENT AND PLAN:  1. Hypertension. The patient's blood pressure is adequately controlled      on his present medical regimen. He will continue the same.  2. Hyperlipidemia. The patient is at goal with a LDL cholesterol of      less than 130, and actually his cholesterol is better than before.      Plan:  The patient will be continued on Lipitor and Mediterranean      diet.  3. Insomnia. Discussed with the patient the importance of sleep      hygiene and particularly processing data prior to sleep. Discussed      the principle of a sleep sanctuary. Plan:  The patient to follow      sleep hygiene techniques. He is described Zolpidem 10 mg to take      every night on a p.r.n. basis.  4. Health maintenance. The patient is for colonoscopy on January 31.      He is otherwise current and up to date and doing well.   The patient is asked to return to see me in 1 year for routine followup evaluation and exam, sooner on an as-needed basis.     Rosalyn Gess Norins, MD  Electronically Signed    MEN/MedQ  DD: 04/08/2006  DT: 04/08/2006  Job #: [id number]   cc:   Tilden Dome

## 2010-07-27 NOTE — Op Note (Signed)
[facility]  Patient:    Zachary Gallegos, Zachary Gallegos                    MRN: [id number] Proc. Date: 10/25/99 Attending:  Margit Banda. Jearld Fenton, M.D. CC:         Barbaraann Share, M.D. [facility]             Michael E. Norins, M.D. [facility]                           Operative Report  PREOPERATIVE DIAGNOSIS:  Obstructive sleep apnea.  POSTOPERATIVE DIAGNOSIS:  Obstructive sleep apnea.  SURGICAL PROCEDURE: 1. Septoplasty. 2. Submucous resection of inferior turbinates. 3. Uvulopharangeal palatoplasty with tonsillectomy.  ANESTHESIA:  General endotracheal tube.  ESTIMATED BLOOD LOSS:  Approximately 10 cc.  INDICATIONS:  This is a 60 year old who has had a significant amount of obstructive sleep apnea that has been evaluated by Dr. Marcelyn Bruins, and the patient is interested in a surgical repair because the CPAP was not something that was going to be appealing to him and could not tolerate it.  The patient also has nasal obstruction which has been refractory to medical therapy.  He was informed the risks and benefits of the procedure, including bleeding, infection, velopharyngeal insufficiency, change in the voice, chronic pain, septal perforation and chronic drying, collapse of his nose, and risk of the anesthetic.  All questions were answered and consent was obtained.  OPERATION:  The patient was taken to the operating room and placed in a supine position.  After adequate general endotracheal tube anesthesia was placed in the supine position, prepped and draped in the usual sterile manner.  The nose was begun with placing oxymetazoline pledgets in the nose bilaterally and the septum and inferior turbinates injected with 1% lidocaine with 1:100,000 epinephrine.  The right hemitransfixed incision was performed raising the mucoperichondrium and ostial flap.  The deviated portion of the cartilage was divided with a Therapist, nutritional about 2 cm posterior to the caudal strut.   The posterior cartilage was removed with the Therapist, nutritional.  The opposite flap was elevated and the Jansen-Middletons were used to remove the superior deviation of the bone to the right and an inferior spur.  This corrected the septal deflection.  The turbinates were in-fractured and a midline incision was made with a 15 blade.  Mucosal flap was elevated superiorly with a Therapist, nutritional.  The inferior mucosa and bone were removed with the turbinate scissors, and the edge was cauterized with suction cautery.  The turbinate was then out-fractured and the mucosal flap was laid back down over the raw surface.  The Crowe-Davis mouthgag was then inserted, retracted, and suspended from the Mayo stand.  The palate had been marked with [place] ink and the incision was made right at the level about 1 cm below this mark and dissection was carried into the left side where the capsule of the tonsil was identified. The palate and uvula were divided with the cautery, bringing the tonsil and palate tissue together over to the right side where the tonsil was also removed by identifying the capsule and removed along the capsule with electrocautery dissection.  The palate, uvula, and tonsils were all removed in a one block specimen.  The suction cautery was then used to obtain hemostasis in the tonsillar fossas.  The 3-0 Vicryl sutures were then used to close the palate and tonsillar fossa area, and this  was with interrupted sutures.  The oral cavity, oropharynx, was irrigated with saline.  There was good hemostasis.  The hemitransfixion incision had been closed with interrupted 4-0 chromic and 4-0 plain gut placed through the septum.  A rolled up Telfa soaked with bacitracin was placed into the nose bilaterally and secured with a 3-0 nylon.  The patient was awakened, brought to recovery in stable condition. Counts correct. DD:  10/25/99 TD:  10/25/99 Job: [id number]

## 2010-09-19 ENCOUNTER — Other Ambulatory Visit: Payer: Self-pay | Admitting: *Deleted

## 2010-09-19 MED ORDER — ENALAPRIL MALEATE 10 MG PO TABS: 10.0000 mg | ORAL_TABLET | Freq: Every day | ORAL | Status: DC

## 2010-11-26 ENCOUNTER — Other Ambulatory Visit: Payer: No Typology Code available for payment source

## 2010-12-03 ENCOUNTER — Encounter: Payer: Self-pay | Admitting: Internal Medicine

## 2010-12-03 ENCOUNTER — Ambulatory Visit (INDEPENDENT_AMBULATORY_CARE_PROVIDER_SITE_OTHER): Payer: BC Managed Care – PPO | Admitting: Internal Medicine

## 2010-12-03 DIAGNOSIS — R3919 Other difficulties with micturition: Secondary | ICD-10-CM

## 2010-12-03 DIAGNOSIS — I10 Essential (primary) hypertension: Secondary | ICD-10-CM

## 2010-12-03 DIAGNOSIS — E785 Hyperlipidemia, unspecified: Secondary | ICD-10-CM

## 2010-12-03 DIAGNOSIS — M67919 Unspecified disorder of synovium and tendon, unspecified shoulder: Secondary | ICD-10-CM

## 2010-12-03 DIAGNOSIS — Z0000 Encounter for general adult medical examination without abnormal findings: Secondary | ICD-10-CM

## 2010-12-03 NOTE — Progress Notes (Signed)
Subjective:    Patient ID: Zachary Gallegos, male    DOB: 1951-02-12, 60 y.o.   MRN: [id number]  HPI Zachary Gallegos presents for an annual medical exam. In the interval he ruptured his right achilles tendon playing tennis requiring surgical intervention in June. Never had any pain with the whole process. He is making a good recovery but is not yet back up to full-steam. As a consequence he has put on 6-7 lbs despite dieting. He has been doing PT and is out of the "Boot."  Wart on right elbow.  Right shoulder pain s/p cortisone injection x 2: 1st time worked, 2nd time not as effective. He has been doing physical therapy daily with 80-90% improvement.   Had one episode 8 weeks ago of sudden on set of vertigo with a prodrome of flickering light.   Past Medical History  Diagnosis Date  . Hyperlipidemia   . Hypertension   . Thalassanemia   . Near syncope     with extensive cardiac evaluation- nuc stress '10   Past Surgical History  Procedure Date  . Uvulopalatopharyngoplasty, tonsillectomy and septoplasty    Family History  Problem Relation Age of Onset  . Bipolar disorder Mother   . Hyperlipidemia Father    History   Social History  . Marital Status: Married    Spouse Name: N/A    Number of Children: N/A  . Years of Education: N/A   Occupational History  . Not on file.   Social History Main Topics  . Smoking status: Not on file  . Smokeless tobacco: Not on file  . Alcohol Use:   . Drug Use:   . Sexually Active:    Other Topics Concern  . Not on file   Social History Narrative   Shelbie Ammons [employer]- BS Business; [place] '751 son '89; 2 daughters '81, '84Work: self empl.Got a great price on a '99 mercedes sl 500 convertaible (june '11)Mother in [place] recently died. Family is making appropriate adjustment.        Review of Systems Review of Systems  Constitutional:  Negative for fever, chills, activity change and unexpected weight change.  HEENT:   Negative for hearing loss, ear pain, congestion, neck stiffness and postnasal drip. Negative for sore throat or swallowing problems. Negative for dental complaints.   Eyes: Negative for vision loss or change in visual acuity.  Respiratory: Negative for chest tightness and wheezing.   Cardiovascular: Negative for chest pain and palpitation. No decreased exercise tolerance Gastrointestinal: No change in bowel habit. No bloating or gas. No reflux or indigestion Genitourinary: Negative for urgency, frequency, flank pain and difficulty urinating.  Musculoskeletal: Negative for myalgias, back pain, arthralgias and gait problem. chronic shoulder pain right, recovering right achilles.  Neurological: Negative for dizziness, tremors, weakness and headaches.  Hematological: Negative for adenopathy.  Psychiatric/Behavioral: Negative for behavioral problems and dysphoric mood.       Objective:   Physical Exam Vital signs reviewed Gen'l: Well nourished well developed     male in no acute distress  HEENT: Head: Normocephalic and atraumatic. Right Ear: External ear normal. EAC/TM nl. Left Ear: External ear normal.  EAC/TM nl. Nose: Nose normal. Mouth/Throat: Oropharynx is clear and moist. Dentition - native, in good repair. No buccal or palatal lesions. Posterior pharynx clear. Eyes: Conjunctivae and sclera clear. EOM intact. Pupils are equal, round, and reactive to light. Right eye exhibits no discharge. Left eye exhibits no discharge. Neck: Normal range of motion. Neck supple. No JVD present.  No tracheal deviation present. No thyromegaly present.  Cardiovascular: Normal rate, regular rhythm, no gallop, no friction rub, no murmur heard.      Quiet precordium. 2+ radial and DP pulses . No carotid bruits Pulmonary/Chest: Effort normal. No respiratory distress or increased WOB, no wheezes, no rales. No chest wall deformity or CVAT. Abdominal: Soft. Bowel sounds are normal in all quadrants. He exhibits no  distension, no tenderness, no rebound or guarding, No heptosplenomegaly  Genitourinary:   Musculoskeletal: Normal range of motion except for decreased ROM right ankle. Right shoulder with full ROM. He exhibits no edema and no tenderness.       Small and large joints without redness, synovial thickening or deformity. Lymphadenopathy:    He has no cervical or supraclavicular adenopathy.  Neurological: He is alert and oriented to person, place, and time. CN II-XII intact. DTRs 2+ and symmetrical biceps, radial and patellar tendons. Cerebellar function normal with no tremor, rigidity, normal gait and station.  Skin: Skin is warm and dry. No rash noted. No erythema.  Psychiatric: He has a normal mood and affect. His behavior is normal. Thought content normal.         Assessment & Plan:

## 2010-12-05 DIAGNOSIS — Z0000 Encounter for general adult medical examination without abnormal findings: Secondary | ICD-10-CM | POA: Insufficient documentation

## 2010-12-05 NOTE — Assessment & Plan Note (Signed)
BP Readings from Last 3 Encounters:  12/03/10 136/78  04/12/10 112/78  08/09/09 112/74   Mild elevation of BP since last visit due in large measure to decreased activity and weight gain associated with his ankle injury.  Plan - continue present medication           Resume regular exercise when possible - low stress, i.e. Water exercise           Weight loss

## 2010-12-05 NOTE — Assessment & Plan Note (Signed)
No recurrence of true syncope. Isolated episode noted per HPI

## 2010-12-05 NOTE — Assessment & Plan Note (Signed)
Interval history as noted - ruptured achilles tendon right with surgical repair. He has otherwise had no events. Physical exam is normal. No labs this visit. Review of previous labs reveals normal values except for lipids. He is current with colorectal cancer screening. Discussed prostate cancer screening including USPHCTF recommendations - at this time he opts out of PSA screening. Immunizations - will be due Tdap in 2013. Differed EKG - patient had preop EKG and was not informed of any abnormality.  In summary - a very nice man who seems healthy and stable. He is making a good recovery from his achilles rupture. He does invest in his health, i.e. Diet and exercise (when he can). He does understand the relationship between his cholesterol levels and cardiac risk. He will return in 1 year or as needed.

## 2010-12-05 NOTE — Assessment & Plan Note (Signed)
Marked improvement with ROM exercise. Has virtually no limitation in activity.  Plan - continue ROM exercise

## 2010-12-05 NOTE — Assessment & Plan Note (Signed)
Chronically elevated lipids. Most recent LDL 220. He has been reluctant to take medication for this. He is fully aware of the significance of hyperlipidemia and the increased risk for atherosclerosis including Coronary artery disease.  Plan - he will work on life-style management           Wants to due repeat lipid panel in 6-12 months           Continues to be drug averse in regard to treatment.

## 2011-04-24 ENCOUNTER — Other Ambulatory Visit: Payer: Self-pay

## 2011-04-24 MED ORDER — ENALAPRIL MALEATE 10 MG PO TABS: 10.0000 mg | ORAL_TABLET | Freq: Every day | ORAL | Status: DC

## 2011-07-15 ENCOUNTER — Other Ambulatory Visit: Payer: Self-pay | Admitting: Internal Medicine

## 2011-08-13 ENCOUNTER — Encounter: Payer: Self-pay | Admitting: Internal Medicine

## 2011-08-13 ENCOUNTER — Ambulatory Visit (INDEPENDENT_AMBULATORY_CARE_PROVIDER_SITE_OTHER): Payer: BC Managed Care – PPO | Admitting: Internal Medicine

## 2011-08-13 VITALS — BP 128/80 | HR 67 | Temp 98.0°F | Resp 16 | Wt 193.0 lb

## 2011-08-13 DIAGNOSIS — M79672 Pain in left foot: Secondary | ICD-10-CM

## 2011-08-13 DIAGNOSIS — M79609 Pain in unspecified limb: Secondary | ICD-10-CM

## 2011-08-13 DIAGNOSIS — E785 Hyperlipidemia, unspecified: Secondary | ICD-10-CM

## 2011-08-13 DIAGNOSIS — I739 Peripheral vascular disease, unspecified: Secondary | ICD-10-CM

## 2011-08-13 NOTE — Patient Instructions (Signed)
Sore swollen left foot - question of gout? Arthritis of another sort. Now looks better - not tender or hot now. Plan - will check a uric acid level to determine your risk.  Cold foot and weak pulse, plus leg fatigue with work, i.e. Stair climbing - suggestive of PAD (blocker arteries), especially in the setting of very high cholesterol. Plan - Lower extremity arterial dopplers - you will be called   Elevated cholesterol - we really need to treat this. Plan Crestor 5 mg ( 1/2 a 10 mg) three days a week. Lab follow up in 3 or more weeks.

## 2011-08-15 ENCOUNTER — Encounter: Payer: Self-pay | Admitting: Internal Medicine

## 2011-08-15 DIAGNOSIS — I739 Peripheral vascular disease, unspecified: Secondary | ICD-10-CM | POA: Insufficient documentation

## 2011-08-15 NOTE — Assessment & Plan Note (Signed)
Patient has symptoms of leg weakness/fatigue with stair climbing, but not when using the elliptical machine, with rapid recovery with rest that suggests claudication. On exam his feet are cool, pulses weak and there is poor capillary refill.  Plan -  LE arterial doppler study

## 2011-08-15 NOTE — Assessment & Plan Note (Signed)
Lab Results  Component Value Date   CHOL 306* 08/02/2009   HDL 47.00 08/02/2009   LDLCALC 131* 06/09/2007   LDLDIRECT 220.3 08/02/2009   TRIG 189.0* 08/02/2009   CHOLHDL 7 08/02/2009   Discussed the high risk associated with an LDL of this magnitude.  Plan  Trial of crestor 5 mg 3/wk - MWF  Follow-up lab in 3+ weeks

## 2011-08-15 NOTE — Progress Notes (Signed)
  Subjective:    Patient ID: Zachary Gallegos, male    DOB: Mar 17, 1950, 61 y.o.   MRN: [id number]  HPI Mr. Clausen presents for evaluation of a painful left foot. He reports a 36 hr history of pain: initially he had some swelling in the left foot mostly at the MTP area and worst at  1st MCP. The area became very red but not hot. He has a peripheral neuropathy but he had enough discomfort that it interfered with sleep. The discomfort has subsided but the area remains somewhat swollen and the skin color is much darker than the right foot.   Reviewed labs: his last LDL was 220. He has a history of hyperlipidemia, most likely familial, but has declined treatment. He attributes his permanent neuropathy to his initial treatment with lipitor. He does have a positive family hx ASVD. His diet is good and his weight is very reasonable.  Past Medical History  Diagnosis Date  . Hyperlipidemia   . Hypertension   . Thalassanemia   . Near syncope     with extensive cardiac evaluation- nuc stress '10   Past Surgical History  Procedure Date  . Uvulopalatopharyngoplasty, tonsillectomy and septoplasty    Family History  Problem Relation Age of Onset  . Bipolar disorder Mother   . Mental illness Mother   . Hyperlipidemia Father   . Heart disease Father   . Cancer Neg Hx   . COPD Neg Hx   . Diabetes Neg Hx    History   Social History  . Marital Status: Married    Spouse Name: N/A    Number of Children: 2  . Years of Education: 18   Occupational History  . testile executive     self-employed   Social History Main Topics  . Smoking status: Never Smoker   . Smokeless tobacco: Never Used  . Alcohol Use: 2.5 oz/week    5 drink(s) per week  . Drug Use: No  . Sexually Active: Yes -- Male partner(s)   Other Topics Concern  . Not on file   Social History Narrative   Shelbie Ammons [address] BS Business; [place]. Married '75. 1 son '89; 2 daughters '81, '84. Work: self empl. - textile  mfg. Got a great price on a '99 mercedes sl 500 convertaible (june '11). Mother in [place] recently died ('39). Family is making appropriate adjustment.       Review of Systems System review is negative for any constitutional, cardiac, pulmonary, GI or neuro symptoms or complaints other than as described in the HPI.     Objective:   Physical Exam Filed Vitals:   08/13/11 1505  BP: 128/80  Pulse: 67  Temp: 98 F (36.7 C)  Resp: 16   Weight: 193 lb (87.544 kg)  Gen'l- WNWD white man in no distress HEENT - C&S clear Cor- 2+ radial pulse; trace DP left, 1+ PT left, dark rubor noted, very cool to touch, very slow capillary refill; 1+ DP right, 1+ PT right, cool to the touch but warmer than left, slow capillary refill. RRR        Assessment & Plan:  Foot pain - flare of OA vs gout. His neuropathy would have deadened the pain so history is a little less helpful. On exam there is no evidence of gout.  Plan -  NSAIDs of choice for recurrent pain  Will need uric acid level at next lab.

## 2011-10-03 ENCOUNTER — Other Ambulatory Visit (INDEPENDENT_AMBULATORY_CARE_PROVIDER_SITE_OTHER): Payer: BC Managed Care – PPO

## 2011-10-03 DIAGNOSIS — E785 Hyperlipidemia, unspecified: Secondary | ICD-10-CM

## 2011-10-03 DIAGNOSIS — M79672 Pain in left foot: Secondary | ICD-10-CM

## 2011-10-03 DIAGNOSIS — M79609 Pain in unspecified limb: Secondary | ICD-10-CM

## 2011-10-03 LAB — LIPID PANEL
HDL: 57.3 mg/dL (ref >39.00)
Total CHOL/HDL Ratio: 4
VLDL: 25.8 mg/dL (ref 0.0–40.0)

## 2011-10-03 LAB — LDL CHOLESTEROL, DIRECT: Direct LDL: 164.7 mg/dL

## 2011-10-03 LAB — HEPATIC FUNCTION PANEL
Alkaline Phosphatase: 48 U/L (ref 39–117)
Bilirubin, Direct: 0.2 mg/dL (ref 0.0–0.3)

## 2011-10-06 ENCOUNTER — Encounter: Payer: Self-pay | Admitting: Internal Medicine

## 2011-12-16 ENCOUNTER — Other Ambulatory Visit: Payer: Self-pay | Admitting: Internal Medicine

## 2012-02-12 ENCOUNTER — Encounter: Payer: BC Managed Care – PPO | Admitting: Internal Medicine

## 2012-03-17 ENCOUNTER — Other Ambulatory Visit: Payer: Self-pay | Admitting: *Deleted

## 2012-03-17 MED ORDER — ENALAPRIL MALEATE 10 MG PO TABS: 10.0000 mg | ORAL_TABLET | Freq: Every day | ORAL | Status: DC

## 2012-04-16 ENCOUNTER — Encounter: Payer: Self-pay | Admitting: Internal Medicine

## 2012-04-16 ENCOUNTER — Ambulatory Visit (INDEPENDENT_AMBULATORY_CARE_PROVIDER_SITE_OTHER): Payer: BC Managed Care – PPO | Admitting: Internal Medicine

## 2012-04-16 VITALS — BP 130/80 | HR 84 | Temp 98.3°F | Resp 10 | Ht 73.0 in | Wt 194.1 lb

## 2012-04-16 DIAGNOSIS — I739 Peripheral vascular disease, unspecified: Secondary | ICD-10-CM

## 2012-04-16 DIAGNOSIS — Z0000 Encounter for general adult medical examination without abnormal findings: Secondary | ICD-10-CM

## 2012-04-16 DIAGNOSIS — I10 Essential (primary) hypertension: Secondary | ICD-10-CM

## 2012-04-16 DIAGNOSIS — E785 Hyperlipidemia, unspecified: Secondary | ICD-10-CM

## 2012-04-16 DIAGNOSIS — Z23 Encounter for immunization: Secondary | ICD-10-CM

## 2012-04-16 MED ORDER — EZETIMIBE 10 MG PO TABS: 10.0000 mg | ORAL_TABLET | Freq: Every day | ORAL | Status: DC

## 2012-04-16 NOTE — Patient Instructions (Addendum)
Thanks for coming in.  Cholesterol - will try to get this down. Your cardiac risk calculation based on Framingham data was 12% risk of a cardiac event in the next 10 years. Plan - trial of Zetia 10 mg once a day.  Follow up lab in 4 weeks  Peripheral arterial disease - weak pulses but no symptoms therefore will not pursue a doppler study.  Immunizations - Tdap and pneumonia vaccine. Check on coverage for Shingles vaccine  Advanced Care Planning - a real issue. Look over the MOST form sample. Also, TheConversationProject.org for more information about these issues.  Drive safe - top down.

## 2012-04-16 NOTE — Progress Notes (Signed)
Subjective:    Patient ID: Zachary Gallegos, male    DOB: 1950/12/15, 62 y.o.   MRN: [id number]  HPI The patient is here for annual wellness examination and management of other chronic and acute problems.  In the interval since his last visit he has tried crestor and still have adverse reaction and stopped medication.  His sore foot has resolved.  He was to have LE Art Dop (was ordered) but it was never scheduled. He does not have claudication or non-healing ulcers.  Peripheral neuropathy - no change.     The risk factors are reflected in the social history.  The roster of all physicians providing medical care to patient - is listed in the Snapshot section of the chart.  Activities of daily living:  The patient is 100% inedpendent in all ADLs: dressing, toileting, feeding as well as independent mobility  Home safety : The patient has smoke detectors in the home. They wear seatbelts. No firearms at home. There is no violence in the home.   There is no risks for hepatitis, STDs or HIV. There is no history of blood transfusion. They have no travel history to infectious disease endemic areas of the world.  The patient has seen their dentist in the last six month. They have not seen their eye doctor in the last year. They deny any hearing difficulty and have not had audiologic testing in the last year.    They do not  have excessive sun exposure. Discussed the need for sun protection: hats, long sleeves and use of sunscreen if there is significant sun exposure.   Diet: the importance of a healthy diet is discussed. They do have a healthy diet.  The patient has a regular exercise program: aerobic, 45 duration, 7 days per week.  The benefits of regular aerobic exercise were discussed.  Depression screen: there are no signs or vegative symptoms of depression- irritability, change in appetite, anhedonia, sadness/tearfullness.  Cognitive assessment: the patient manages all their financial  and personal affairs and is actively engaged.   The following portions of the patient's history were reviewed and updated as appropriate: allergies, current medications, past family history, past medical history,  past surgical history, past social history  and problem list.  Vision, hearing, body mass index were assessed and reviewed.   Past Medical History  Diagnosis Date  . Hyperlipidemia   . Hypertension   . Thalassanemia   . Near syncope     with extensive cardiac evaluation- nuc stress '10   Past Surgical History  Procedure Date  . Uvulopalatopharyngoplasty, tonsillectomy and septoplasty    Family History  Problem Relation Age of Onset  . Bipolar disorder Mother   . Mental illness Mother   . Hyperlipidemia Father   . Heart disease Father   . Cancer Neg Hx   . COPD Neg Hx   . Diabetes Neg Hx    History   Social History  . Marital Status: Married    Spouse Name: N/A    Number of Children: 2  . Years of Education: 18   Occupational History  . testile executive     self-employed   Social History Main Topics  . Smoking status: Never Smoker   . Smokeless tobacco: Never Used  . Alcohol Use: 2.5 oz/week    5 drink(s) per week  . Drug Use: No  . Sexually Active: Yes -- Male partner(s)   Other Topics Concern  . Not on file   Social History Narrative  [address] [employer]- BS Business; [place]. Married '75. 1 son '89; 2 daughters '81, '84. Work: self empl. - textile mfg. Got a great price on a '99 mercedes sl 500 convertaible (june '11). Mother in [place] recently died ('26). Family is making appropriate adjustment.Regular exercise: dailyCaffeine use: coffee daily    Current Outpatient Prescriptions on File Prior to Visit  Medication Sig Dispense Refill  . Ascorbic Acid (VITAMIN C) 1000 MG tablet Take 1,000 mg by mouth daily.        Marland Kitchen aspirin 325 MG EC tablet Take 325 mg by mouth 2 (two) times daily.        . enalapril (VASOTEC) 10 MG tablet Take 1 tablet (10  mg total) by mouth daily.  30 tablet  1  . MULTIPLE VITAMIN PO Take by mouth daily.        . Omega-3 Fatty Acids (FISH OIL) 1000 MG CAPS Take 3 capsules by mouth daily.         During the course of the visit the patient was educated and counseled about appropriate screening and preventive services including : fall prevention , diabetes screening, nutrition counseling, colorectal cancer screening, and recommended immunizations.    Review of Systems Constitutional:  Negative for fever, chills, activity change and unexpected weight change.  HEENT:  Negative for hearing loss, ear pain, congestion, neck stiffness and postnasal drip. Negative for sore throat or swallowing problems. Negative for dental complaints.   Eyes: Negative for vision loss or change in visual acuity.  Respiratory: Negative for chest tightness and wheezing. Negative for DOE.   Cardiovascular: Negative for chest pain or palpitations. No decreased exercise tolerance Gastrointestinal: No change in bowel habit. No bloating or gas. No reflux or indigestion Genitourinary: Negative for urgency, frequency, flank pain and difficulty urinating.  Musculoskeletal: Negative for myalgias, back pain, arthralgias and gait problem.  Neurological: Negative for dizziness, tremors, weakness and headaches.  Hematological: Negative for adenopathy.  Psychiatric/Behavioral: Negative for behavioral problems and dysphoric mood.       Objective:   Physical Exam Filed Vitals:   04/16/12 1331  BP: 130/80  Pulse: 84  Temp: 98.3 F (36.8 C)  Resp: 10   Wt Readings from Last 3 Encounters:  04/16/12 194 lb 1.9 oz (88.052 kg)  08/13/11 193 lb (87.544 kg)  12/03/10 201 lb (91.173 kg)   Gen'l: Well nourished well developed white male in no acute distress  HEENT: Head: Normocephalic and atraumatic. Right Ear: External ear normal. EAC/TM nl. Left Ear: External ear normal.  EAC/TM nl. Nose: Nose normal. Mouth/Throat: Oropharynx is clear and moist.  Dentition - native, in good repair. No buccal or palatal lesions. Posterior pharynx clear. Eyes: Conjunctivae and sclera clear. EOM intact. Pupils are equal, round, and reactive to light. Right eye exhibits no discharge. Left eye exhibits no discharge. Neck: Normal range of motion. Neck supple. No JVD present. No tracheal deviation present. No thyromegaly present.  Cardiovascular: Normal rate, regular rhythm, no gallop, no friction rub, no murmur heard.      Quiet precordium. 1+ radial and 2+ DP pulses . Cool feet distally with slow capillary refill. No carotid bruits. No JVD Pulmonary/Chest: Effort normal. No respiratory distress or increased WOB, no wheezes, no rales. No chest wall deformity or CVAT. Abdomen: Soft. Bowel sounds are normal in all quadrants. He exhibits no distension, no tenderness, no rebound or guarding, No heptosplenomegaly  Genitourinary: deferred exam   Musculoskeletal: Normal range of motion. He exhibits no edema and no tenderness.  Small and large joints without redness, synovial thickening or deformity. Full range of motion preserved about all small, median and large joints.  Lymphadenopathy:    He has no cervical or supraclavicular adenopathy.  Neurological: He is alert and oriented to person, place, and time. CN II-XII intact. DTRs 2+ and symmetrical biceps, radial and patellar tendons. Cerebellar function normal with no tremor, rigidity, normal gait and station.  Skin: Skin is warm and dry. No rash noted. No erythema.  Psychiatric: He has a normal mood and affect. His behavior is normal. Thought content normal.         Assessment & Plan:

## 2012-04-17 ENCOUNTER — Other Ambulatory Visit: Payer: Self-pay | Admitting: *Deleted

## 2012-04-17 ENCOUNTER — Other Ambulatory Visit: Payer: Self-pay | Admitting: Internal Medicine

## 2012-04-17 MED ORDER — ENALAPRIL MALEATE 10 MG PO TABS: 10.0000 mg | ORAL_TABLET | Freq: Every day | ORAL | Status: DC

## 2012-04-18 NOTE — Assessment & Plan Note (Signed)
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 Overal cardiac risk factors are low. NCEP Framingham Risk Calculation = 12% risk of cardiac event in the next 10 years - moderate risk.  Plan Trial of Zetia 10 mg daily  F/u lab in 4 weeks.

## 2012-04-18 NOTE — Assessment & Plan Note (Signed)
LA Arterial Doppler ordered at last visit never scheduled. Fortunately foot pain resolved. He has no claudication, no ulcers. On exam there is a palpable DP and PT pulse bilaterally. Distal foot with slow capillary refill.  Plan - no further evaluation at this time  He is made aware of the symptoms of claudication.

## 2012-04-18 NOTE — Assessment & Plan Note (Signed)
Interval medical history without major illness, injury or surgery. Physical exam is normal. He is current with colorectal cancer screening. Discussed pros and cons of prostate cancer screening (USPHCTF recommendations reviewed and ACU April '13 recommendations) and he defers evaluation at this time with normal and stable PSA readings '08, '09, '10, '11. Immunizations - brought up to date for Tdap and pneumonia vaccine. Rx provided for Zostavax.  In summary - a great guy who is medically stable. 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 He is encouraged to continue his healthy life-style. He will return for lab 4 weeks after starting Zetia. Return office visit as needed or routine exam in one year.

## 2012-04-18 NOTE — Assessment & Plan Note (Signed)
BP Readings from Last 3 Encounters:  04/16/12 130/80  08/13/11 128/80  12/03/10 136/78   Good control on single agent  Plan Continue present medication  Routine lab - Bmet at next blood draw.

## 2012-04-20 ENCOUNTER — Other Ambulatory Visit: Payer: Self-pay | Admitting: Internal Medicine

## 2012-06-22 ENCOUNTER — Encounter: Payer: Self-pay | Admitting: Internal Medicine

## 2012-06-22 DIAGNOSIS — Z125 Encounter for screening for malignant neoplasm of prostate: Secondary | ICD-10-CM

## 2012-06-22 NOTE — Addendum Note (Signed)
Addended by: Illene Regulus E on: 06/22/2012 02:00 PM   Modules accepted: Orders

## 2012-06-22 NOTE — Telephone Encounter (Signed)
Vaccine added to record.

## 2012-06-25 ENCOUNTER — Other Ambulatory Visit (INDEPENDENT_AMBULATORY_CARE_PROVIDER_SITE_OTHER): Payer: BC Managed Care – PPO

## 2012-06-25 DIAGNOSIS — E785 Hyperlipidemia, unspecified: Secondary | ICD-10-CM

## 2012-06-25 DIAGNOSIS — Z125 Encounter for screening for malignant neoplasm of prostate: Secondary | ICD-10-CM

## 2012-06-25 DIAGNOSIS — I10 Essential (primary) hypertension: Secondary | ICD-10-CM

## 2012-06-25 LAB — LIPID PANEL
HDL: 46.7 mg/dL (ref >39.00)
VLDL: 26.2 mg/dL (ref 0.0–40.0)

## 2012-06-25 LAB — COMPREHENSIVE METABOLIC PANEL
ALT: 33 U/L (ref 0–53)
AST: 20 U/L (ref 0–37)
Albumin: 4.5 g/dL (ref 3.5–5.2)
CO2: 29 mEq/L (ref 19–32)
Calcium: 9.7 mg/dL (ref 8.4–10.5)
Chloride: 105 mEq/L (ref 96–112)
GFR: 89.95 mL/min (ref >60.00)
Potassium: 5 mEq/L (ref 3.5–5.1)

## 2012-06-25 LAB — PSA: PSA: 1.96 ng/mL (ref 0.10–4.00)

## 2012-06-25 LAB — HEPATIC FUNCTION PANEL
Albumin: 4.5 g/dL (ref 3.5–5.2)
Total Protein: 7.3 g/dL (ref 6.0–8.3)

## 2012-06-25 LAB — LDL CHOLESTEROL, DIRECT: Direct LDL: 159.9 mg/dL

## 2013-01-14 ENCOUNTER — Other Ambulatory Visit: Payer: Self-pay

## 2013-03-11 HISTORY — PX: BASAL CELL CARCINOMA EXCISION: SHX1214

## 2013-04-05 ENCOUNTER — Other Ambulatory Visit: Payer: Self-pay | Admitting: Internal Medicine

## 2014-03-23 ENCOUNTER — Encounter: Payer: Self-pay | Admitting: Internal Medicine

## 2014-03-24 ENCOUNTER — Telehealth: Payer: Self-pay | Admitting: Internal Medicine

## 2014-03-24 NOTE — Telephone Encounter (Signed)
Spoke to patient. Advised that I have verified that we will not treat him as a new patient visit on 03/30/2014. This will be treated as his annual wellness. Since he was a [facility]ins patient, he is already established. Verified this with site manager

## 2014-03-30 ENCOUNTER — Ambulatory Visit (INDEPENDENT_AMBULATORY_CARE_PROVIDER_SITE_OTHER): Payer: BLUE CROSS/BLUE SHIELD | Admitting: Internal Medicine

## 2014-03-30 ENCOUNTER — Other Ambulatory Visit (INDEPENDENT_AMBULATORY_CARE_PROVIDER_SITE_OTHER): Payer: BLUE CROSS/BLUE SHIELD

## 2014-03-30 ENCOUNTER — Encounter: Payer: Self-pay | Admitting: Internal Medicine

## 2014-03-30 VITALS — BP 138/78 | HR 74 | Temp 97.9°F | Resp 12 | Ht 73.0 in | Wt 195.1 lb

## 2014-03-30 DIAGNOSIS — Z0000 Encounter for general adult medical examination without abnormal findings: Secondary | ICD-10-CM

## 2014-03-30 DIAGNOSIS — I10 Essential (primary) hypertension: Secondary | ICD-10-CM

## 2014-03-30 DIAGNOSIS — E785 Hyperlipidemia, unspecified: Secondary | ICD-10-CM

## 2014-03-30 LAB — COMPREHENSIVE METABOLIC PANEL
ALT: 41 U/L (ref 0–53)
AST: 25 U/L (ref 0–37)
Albumin: 4.8 g/dL (ref 3.5–5.2)
Alkaline Phosphatase: 54 U/L (ref 39–117)
BUN: 20 mg/dL (ref 6–23)
CALCIUM: 10.1 mg/dL (ref 8.4–10.5)
CHLORIDE: 105 meq/L (ref 96–112)
CO2: 28 meq/L (ref 19–32)
Creatinine, Ser: 1.12 mg/dL (ref 0.40–1.50)
GFR: 70.38 mL/min (ref >60.00)
Glucose, Bld: 118 mg/dL — ABNORMAL HIGH (ref 70–99)
POTASSIUM: 5 meq/L (ref 3.5–5.1)
SODIUM: 141 meq/L (ref 135–145)
TOTAL PROTEIN: 7.3 g/dL (ref 6.0–8.3)
Total Bilirubin: 0.8 mg/dL (ref 0.2–1.2)

## 2014-03-30 LAB — LIPID PANEL
Cholesterol: 297 mg/dL — ABNORMAL HIGH (ref 0–200)
HDL: 50.5 mg/dL (ref >39.00)
NONHDL: 246.5
Total CHOL/HDL Ratio: 6
Triglycerides: 220 mg/dL — ABNORMAL HIGH (ref 0.0–149.0)
VLDL: 44 mg/dL — ABNORMAL HIGH (ref 0.0–40.0)

## 2014-03-30 LAB — LDL CHOLESTEROL, DIRECT: LDL DIRECT: 219 mg/dL

## 2014-03-30 LAB — CBC
HCT: 38 % — ABNORMAL LOW (ref 39.0–52.0)
Hemoglobin: 12.2 g/dL — ABNORMAL LOW (ref 13.0–17.0)
MCHC: 32.1 g/dL (ref 30.0–36.0)
PLATELETS: 229 10*3/uL (ref 150.0–400.0)
RBC: 6.09 Mil/uL — AB (ref 4.22–5.81)
RDW: 15.4 % (ref 11.5–15.5)
WBC: 5.2 10*3/uL (ref 4.0–10.5)

## 2014-03-30 LAB — PSA: PSA: 2.16 ng/mL (ref 0.10–4.00)

## 2014-03-30 MED ORDER — ZOLPIDEM TARTRATE 10 MG PO TABS: 10.0000 mg | ORAL_TABLET | Freq: Every evening | ORAL | Status: DC | PRN

## 2014-03-30 MED ORDER — ENALAPRIL MALEATE 10 MG PO TABS: 10.0000 mg | ORAL_TABLET | Freq: Every day | ORAL | Status: DC

## 2014-03-30 NOTE — Patient Instructions (Signed)
We will check the blood work today and call you back with the results.   We have refilled the blood pressure medicine as well as the sleeping pill for when you need it.   You are doing well so keep up the good work with exercise.   Come back in about 1 year, if you have any problems or questions sooner please feel free to call the office.   Health Maintenance A healthy lifestyle and preventative care can promote health and wellness.  Maintain regular health, dental, and eye exams.  Eat a healthy diet. Foods like vegetables, fruits, whole grains, low-fat dairy products, and lean protein foods contain the nutrients you need and are low in calories. Decrease your intake of foods high in solid fats, added sugars, and salt. Get information about a proper diet from your health care provider, if necessary.  Regular physical exercise is one of the most important things you can do for your health. Most adults should get at least 150 minutes of moderate-intensity exercise (any activity that increases your heart rate and causes you to sweat) each week. In addition, most adults need muscle-strengthening exercises on 2 or more days a week.   Maintain a healthy weight. The body mass index (BMI) is a screening tool to identify possible weight problems. It provides an estimate of body fat based on height and weight. Your health care provider can find your BMI and can help you achieve or maintain a healthy weight. For males 20 years and older:  A BMI below 18.5 is considered underweight.  A BMI of 18.5 to 24.9 is normal.  A BMI of 25 to 29.9 is considered overweight.  A BMI of 30 and above is considered obese.  Maintain normal blood lipids and cholesterol by exercising and minimizing your intake of saturated fat. Eat a balanced diet with plenty of fruits and vegetables. Blood tests for lipids and cholesterol should begin at age 55 and be repeated every 5 years. If your lipid or cholesterol levels are  high, you are over age 41, or you are at high risk for heart disease, you may need your cholesterol levels checked more frequently.Ongoing high lipid and cholesterol levels should be treated with medicines if diet and exercise are not working.  If you smoke, find out from your health care provider how to quit. If you do not use tobacco, do not start.  Lung cancer screening is recommended for adults aged 54-80 years who are at high risk for developing lung cancer because of a history of smoking. A yearly low-dose CT scan of the lungs is recommended for people who have at least a 30-pack-year history of smoking and are current smokers or have quit within the past 15 years. A pack year of smoking is smoking an average of 1 pack of cigarettes a day for 1 year (for example, a 30-pack-year history of smoking could mean smoking 1 pack a day for 30 years or 2 packs a day for 15 years). Yearly screening should continue until the smoker has stopped smoking for at least 15 years. Yearly screening should be stopped for people who develop a health problem that would prevent them from having lung cancer treatment.  If you choose to drink alcohol, do not have more than 2 drinks per day. One drink is considered to be 12 oz (360 mL) of beer, 5 oz (150 mL) of wine, or 1.5 oz (45 mL) of liquor.  Avoid the use of street drugs. Do  not share needles with anyone. Ask for help if you need support or instructions about stopping the use of drugs.  High blood pressure causes heart disease and increases the risk of stroke. Blood pressure should be checked at least every 1-2 years. Ongoing high blood pressure should be treated with medicines if weight loss and exercise are not effective.  If you are 83-73 years old, ask your health care provider if you should take aspirin to prevent heart disease.  Diabetes screening involves taking a blood sample to check your fasting blood sugar level. This should be done once every 3 years  after age 54 if you are at a normal weight and without risk factors for diabetes. Testing should be considered at a younger age or be carried out more frequently if you are overweight and have at least 1 risk factor for diabetes.  Colorectal cancer can be detected and often prevented. Most routine colorectal cancer screening begins at the age of 28 and continues through age 77. However, your health care provider may recommend screening at an earlier age if you have risk factors for colon cancer. On a yearly basis, your health care provider may provide home test kits to check for hidden blood in the stool. A small camera at the end of a tube may be used to directly examine the colon (sigmoidoscopy or colonoscopy) to detect the earliest forms of colorectal cancer. Talk to your health care provider about this at age 80 when routine screening begins. A direct exam of the colon should be repeated every 5-10 years through age 47, unless early forms of precancerous polyps or small growths are found.  People who are at an increased risk for hepatitis B should be screened for this virus. You are considered at high risk for hepatitis B if:  You were born in a country where hepatitis B occurs often. Talk with your health care provider about which countries are considered high risk.  Your parents were born in a high-risk country and you have not received a shot to protect against hepatitis B (hepatitis B vaccine).  You have HIV or AIDS.  You use needles to inject street drugs.  You live with, or have sex with, someone who has hepatitis B.  You are a man who has sex with other men (MSM).  You get hemodialysis treatment.  You take certain medicines for conditions like cancer, organ transplantation, and autoimmune conditions.  Hepatitis C blood testing is recommended for all people born from [birth year] through 1964 and any individual with known risk factors for hepatitis C.  Healthy men should no longer receive  prostate-specific antigen (PSA) blood tests as part of routine cancer screening. Talk to your health care provider about prostate cancer screening.  Testicular cancer screening is not recommended for adolescents or adult males who have no symptoms. Screening includes self-exam, a health care provider exam, and other screening tests. Consult with your health care provider about any symptoms you have or any concerns you have about testicular cancer.  Practice safe sex. Use condoms and avoid high-risk sexual practices to reduce the spread of sexually transmitted infections (STIs).  You should be screened for STIs, including gonorrhea and chlamydia if:  You are sexually active and are younger than 24 years.  You are older than 24 years, and your health care provider tells you that you are at risk for this type of infection.  Your sexual activity has changed since you were last screened, and you are at  an increased risk for chlamydia or gonorrhea. Ask your health care provider if you are at risk.  If you are at risk of being infected with HIV, it is recommended that you take a prescription medicine daily to prevent HIV infection. This is called pre-exposure prophylaxis (PrEP). You are considered at risk if:  You are a man who has sex with other men (MSM).  You are a heterosexual man who is sexually active with multiple partners.  You take drugs by injection.  You are sexually active with a partner who has HIV.  Talk with your health care provider about whether you are at high risk of being infected with HIV. If you choose to begin PrEP, you should first be tested for HIV. You should then be tested every 3 months for as long as you are taking PrEP.  Use sunscreen. Apply sunscreen liberally and repeatedly throughout the day. You should seek shade when your shadow is shorter than you. Protect yourself by wearing long sleeves, pants, a wide-brimmed hat, and sunglasses year round whenever you are  outdoors.  Tell your health care provider of new moles or changes in moles, especially if there is a change in shape or color. Also, tell your health care provider if a mole is larger than the size of a pencil eraser.  A one-time screening for abdominal aortic aneurysm (AAA) and surgical repair of large AAAs by ultrasound is recommended for men aged 41-75 years who are current or former smokers.  Stay current with your vaccines (immunizations). Document Released: 08/24/2007 Document Revised: 03/02/2013 Document Reviewed: 07/23/2010 [facility] Patient Information 2015 [place], [place]. This information is not intended to replace advice given to you by your health care provider. Make sure you discuss any questions you have with your health care provider.

## 2014-03-30 NOTE — Progress Notes (Signed)
Pre visit review using our clinic review tool, if applicable. No additional management support is needed unless otherwise documented below in the visit note. 

## 2014-04-02 NOTE — Assessment & Plan Note (Signed)
Doing well with enalapril and check BMP today. BP at goal.

## 2014-04-02 NOTE — Assessment & Plan Note (Signed)
Spoke with him about the shingles shot and he is not sold on the benefit and wants to look further into that. He will consider at next visit.

## 2014-04-02 NOTE — Progress Notes (Signed)
   Subjective:    Patient ID: Zachary Gallegos, male    DOB: 28-Apr-1950, 64 y.o.   MRN: [id number]  HPI The patient is a 64 YO man who comes in today to establish care. He is doing well overall and enjoys golf. He is fairly active at home but loves food quite a lot and knows that he should lose some weight. He manages his blood pressure with medication and has no problems with side effects. He is up to date on colon cancer screening. No new allergies.   Review of Systems  Constitutional: Negative for fever, activity change, appetite change, fatigue and unexpected weight change.  HENT: Negative.   Respiratory: Negative for cough, chest tightness, shortness of breath and wheezing.   Cardiovascular: Negative for chest pain, palpitations and leg swelling.  Gastrointestinal: Negative for abdominal pain, diarrhea, constipation, blood in stool and abdominal distention.  Musculoskeletal: Negative.   Neurological: Negative.   Psychiatric/Behavioral: Negative.       Objective:   Physical Exam  Constitutional: He is oriented to person, place, and time. He appears well-developed and well-nourished.  HENT:  Head: Normocephalic and atraumatic.  Eyes: EOM are normal.  Neck: Normal range of motion.  Cardiovascular: Normal rate and regular rhythm.   No murmur heard. Carotids without bruits.   Pulmonary/Chest: Effort normal and breath sounds normal. No respiratory distress. He has no wheezes. He has no rales.  Abdominal: Soft. Bowel sounds are normal. He exhibits no distension. There is no tenderness. There is no rebound.  Musculoskeletal: He exhibits no edema.  Neurological: He is alert and oriented to person, place, and time.  Skin: Skin is warm and dry.  Psychiatric: He has a normal mood and affect. His behavior is normal.   Filed Vitals:   03/30/14 0900  BP: 138/78  Pulse: 74  Temp: 97.9 F (36.6 C)  TempSrc: Oral  Resp: 12  Height: 6\' 1"  (1.854 m)  Weight: 195 lb 1.9 oz (88.506 kg)    SpO2: 97%      Assessment & Plan:

## 2014-04-02 NOTE — Assessment & Plan Note (Signed)
Has tried and failed statins and unwilling to try further. He was tried on zetia last time he was in and did not do well with this either. He is trying to eat Mediterranean diet. Check lipid panel today.

## 2015-04-14 ENCOUNTER — Telehealth: Payer: Self-pay | Admitting: Internal Medicine

## 2015-04-14 MED ORDER — ENALAPRIL MALEATE 10 MG PO TABS: 10.0000 mg | ORAL_TABLET | Freq: Every day | ORAL | Status: DC

## 2015-04-14 NOTE — Telephone Encounter (Signed)
Notified pt 30 day rx has been sent to pharmacy until he see md on 2/17....Zachary Gallegos

## 2015-04-14 NOTE — Telephone Encounter (Signed)
Pt states [facility] on [address] has send some requests in for enalapril (VASOTEC) 10 MG tablet [id number] I did get him scheduled for a physical in a couple weeks

## 2015-04-28 ENCOUNTER — Encounter: Payer: Self-pay | Admitting: Internal Medicine

## 2015-04-28 ENCOUNTER — Other Ambulatory Visit (INDEPENDENT_AMBULATORY_CARE_PROVIDER_SITE_OTHER): Payer: BLUE CROSS/BLUE SHIELD

## 2015-04-28 ENCOUNTER — Ambulatory Visit (INDEPENDENT_AMBULATORY_CARE_PROVIDER_SITE_OTHER): Payer: BLUE CROSS/BLUE SHIELD | Admitting: Internal Medicine

## 2015-04-28 VITALS — BP 138/62 | HR 74 | Temp 98.4°F | Resp 12 | Ht 73.0 in | Wt 195.0 lb

## 2015-04-28 DIAGNOSIS — Z0000 Encounter for general adult medical examination without abnormal findings: Secondary | ICD-10-CM

## 2015-04-28 DIAGNOSIS — E785 Hyperlipidemia, unspecified: Secondary | ICD-10-CM

## 2015-04-28 DIAGNOSIS — I10 Essential (primary) hypertension: Secondary | ICD-10-CM

## 2015-04-28 DIAGNOSIS — R7989 Other specified abnormal findings of blood chemistry: Secondary | ICD-10-CM

## 2015-04-28 LAB — COMPREHENSIVE METABOLIC PANEL
ALK PHOS: 55 U/L (ref 39–117)
ALT: 31 U/L (ref 0–53)
AST: 19 U/L (ref 0–37)
Albumin: 4.8 g/dL (ref 3.5–5.2)
BUN: 22 mg/dL (ref 6–23)
CO2: 28 meq/L (ref 19–32)
Calcium: 10.2 mg/dL (ref 8.4–10.5)
Chloride: 105 mEq/L (ref 96–112)
Creatinine, Ser: 1 mg/dL (ref 0.40–1.50)
GFR: 79.93 mL/min (ref >60.00)
GLUCOSE: 100 mg/dL — AB (ref 70–99)
POTASSIUM: 4.9 meq/L (ref 3.5–5.1)
SODIUM: 143 meq/L (ref 135–145)
TOTAL PROTEIN: 7.3 g/dL (ref 6.0–8.3)
Total Bilirubin: 0.7 mg/dL (ref 0.2–1.2)

## 2015-04-28 LAB — LIPID PANEL
CHOL/HDL RATIO: 6
Cholesterol: 283 mg/dL — ABNORMAL HIGH (ref 0–200)
HDL: 50.2 mg/dL (ref >39.00)
NONHDL: 232.94
Triglycerides: 208 mg/dL — ABNORMAL HIGH (ref 0.0–149.0)
VLDL: 41.6 mg/dL — ABNORMAL HIGH (ref 0.0–40.0)

## 2015-04-28 LAB — PSA: PSA: 2.54 ng/mL (ref 0.10–4.00)

## 2015-04-28 LAB — LDL CHOLESTEROL, DIRECT: LDL DIRECT: 195 mg/dL

## 2015-04-28 MED ORDER — ZOLPIDEM TARTRATE 10 MG PO TABS: 10.0000 mg | ORAL_TABLET | Freq: Every evening | ORAL | Status: DC | PRN

## 2015-04-28 MED ORDER — ENALAPRIL MALEATE 10 MG PO TABS: 10.0000 mg | ORAL_TABLET | Freq: Every day | ORAL | Status: DC

## 2015-04-28 NOTE — Assessment & Plan Note (Signed)
Taking aspirin daily, EKG normal, has tried and failed statins and zetia for the cholesterol.

## 2015-04-28 NOTE — Assessment & Plan Note (Signed)
Colonoscopy due next year, immunizations up to date. Non-smoker and exercises regularly. EKG done and normal. Counseling on sun safety and mole surveillance (history of basal cell).

## 2015-04-28 NOTE — Assessment & Plan Note (Signed)
BP controled on enalapril 10 mg daily, checking CMP and adjust as needed.

## 2015-04-28 NOTE — Patient Instructions (Signed)
We have done the EKG today and it is normal.   We will check the labs and send the results on mychart.   Health Maintenance, Male A healthy lifestyle and preventative care can promote health and wellness.  Maintain regular health, dental, and eye exams.  Eat a healthy diet. Foods like vegetables, fruits, whole grains, low-fat dairy products, and lean protein foods contain the nutrients you need and are low in calories. Decrease your intake of foods high in solid fats, added sugars, and salt. Get information about a proper diet from your health care provider, if necessary.  Regular physical exercise is one of the most important things you can do for your health. Most adults should get at least 150 minutes of moderate-intensity exercise (any activity that increases your heart rate and causes you to sweat) each week. In addition, most adults need muscle-strengthening exercises on 2 or more days a week.   Maintain a healthy weight. The body mass index (BMI) is a screening tool to identify possible weight problems. It provides an estimate of body fat based on height and weight. Your health care provider can find your BMI and can help you achieve or maintain a healthy weight. For males 20 years and older:  A BMI below 18.5 is considered underweight.  A BMI of 18.5 to 24.9 is normal.  A BMI of 25 to 29.9 is considered overweight.  A BMI of 30 and above is considered obese.  Maintain normal blood lipids and cholesterol by exercising and minimizing your intake of saturated fat. Eat a balanced diet with plenty of fruits and vegetables. Blood tests for lipids and cholesterol should begin at age 22 and be repeated every 5 years. If your lipid or cholesterol levels are high, you are over age 68, or you are at high risk for heart disease, you may need your cholesterol levels checked more frequently.Ongoing high lipid and cholesterol levels should be treated with medicines if diet and exercise are not  working.  If you smoke, find out from your health care provider how to quit. If you do not use tobacco, do not start.  Lung cancer screening is recommended for adults aged 65-80 years who are at high risk for developing lung cancer because of a history of smoking. A yearly low-dose CT scan of the lungs is recommended for people who have at least a 30-pack-year history of smoking and are current smokers or have quit within the past 15 years. A pack year of smoking is smoking an average of 1 pack of cigarettes a day for 1 year (for example, a 30-pack-year history of smoking could mean smoking 1 pack a day for 30 years or 2 packs a day for 15 years). Yearly screening should continue until the smoker has stopped smoking for at least 15 years. Yearly screening should be stopped for people who develop a health problem that would prevent them from having lung cancer treatment.  If you choose to drink alcohol, do not have more than 2 drinks per day. One drink is considered to be 12 oz (360 mL) of beer, 5 oz (150 mL) of wine, or 1.5 oz (45 mL) of liquor.  Avoid the use of street drugs. Do not share needles with anyone. Ask for help if you need support or instructions about stopping the use of drugs.  High blood pressure causes heart disease and increases the risk of stroke. High blood pressure is more likely to develop in:  People who have blood  pressure in the end of the normal range (100-139/85-89 mm Hg).  People who are overweight or obese.  People who are African American.  If you are 55-58 years of age, have your blood pressure checked every 3-5 years. If you are 63 years of age or older, have your blood pressure checked every year. You should have your blood pressure measured twice--once when you are at a hospital or clinic, and once when you are not at a hospital or clinic. Record the average of the two measurements. To check your blood pressure when you are not at a hospital or clinic, you can  use:  An automated blood pressure machine at a pharmacy.  A home blood pressure monitor.  If you are 17-28 years old, ask your health care provider if you should take aspirin to prevent heart disease.  Diabetes screening involves taking a blood sample to check your fasting blood sugar level. This should be done once every 3 years after age 21 if you are at a normal weight and without risk factors for diabetes. Testing should be considered at a younger age or be carried out more frequently if you are overweight and have at least 1 risk factor for diabetes.  Colorectal cancer can be detected and often prevented. Most routine colorectal cancer screening begins at the age of 75 and continues through age 75. However, your health care provider may recommend screening at an earlier age if you have risk factors for colon cancer. On a yearly basis, your health care provider may provide home test kits to check for hidden blood in the stool. A small camera at the end of a tube may be used to directly examine the colon (sigmoidoscopy or colonoscopy) to detect the earliest forms of colorectal cancer. Talk to your health care provider about this at age 37 when routine screening begins. A direct exam of the colon should be repeated every 5-10 years through age 45, unless early forms of precancerous polyps or small growths are found.  People who are at an increased risk for hepatitis B should be screened for this virus. You are considered at high risk for hepatitis B if:  You were born in a country where hepatitis B occurs often. Talk with your health care provider about which countries are considered high risk.  Your parents were born in a high-risk country and you have not received a shot to protect against hepatitis B (hepatitis B vaccine).  You have HIV or AIDS.  You use needles to inject street drugs.  You live with, or have sex with, someone who has hepatitis B.  You are a man who has sex with other  men (MSM).  You get hemodialysis treatment.  You take certain medicines for conditions like cancer, organ transplantation, and autoimmune conditions.  Hepatitis C blood testing is recommended for all people born from [birth year] through 1964 and any individual with known risk factors for hepatitis C.  Healthy men should no longer receive prostate-specific antigen (PSA) blood tests as part of routine cancer screening. Talk to your health care provider about prostate cancer screening.  Testicular cancer screening is not recommended for adolescents or adult males who have no symptoms. Screening includes self-exam, a health care provider exam, and other screening tests. Consult with your health care provider about any symptoms you have or any concerns you have about testicular cancer.  Practice safe sex. Use condoms and avoid high-risk sexual practices to reduce the spread of sexually transmitted infections (STIs).  You should be screened for STIs, including gonorrhea and chlamydia if:  You are sexually active and are younger than 24 years.  You are older than 24 years, and your health care provider tells you that you are at risk for this type of infection.  Your sexual activity has changed since you were last screened, and you are at an increased risk for chlamydia or gonorrhea. Ask your health care provider if you are at risk.  If you are at risk of being infected with HIV, it is recommended that you take a prescription medicine daily to prevent HIV infection. This is called pre-exposure prophylaxis (PrEP). You are considered at risk if:  You are a man who has sex with other men (MSM).  You are a heterosexual man who is sexually active with multiple partners.  You take drugs by injection.  You are sexually active with a partner who has HIV.  Talk with your health care provider about whether you are at high risk of being infected with HIV. If you choose to begin PrEP, you should first be tested  for HIV. You should then be tested every 3 months for as long as you are taking PrEP.  Use sunscreen. Apply sunscreen liberally and repeatedly throughout the day. You should seek shade when your shadow is shorter than you. Protect yourself by wearing long sleeves, pants, a wide-brimmed hat, and sunglasses year round whenever you are outdoors.  Tell your health care provider of new moles or changes in moles, especially if there is a change in shape or color. Also, tell your health care provider if a mole is larger than the size of a pencil eraser.  A one-time screening for abdominal aortic aneurysm (AAA) and surgical repair of large AAAs by ultrasound is recommended for men aged 26-75 years who are current or former smokers.  Stay current with your vaccines (immunizations).   This information is not intended to replace advice given to you by your health care provider. Make sure you discuss any questions you have with your health care provider.   Document Released: 08/24/2007 Document Revised: 03/18/2014 Document Reviewed: 07/23/2010 Elsevier Interactive Patient Education [employer].

## 2015-04-28 NOTE — Progress Notes (Signed)
Pre visit review using our clinic review tool, if applicable. No additional management support is needed unless otherwise documented below in the visit note. 

## 2015-04-28 NOTE — Progress Notes (Signed)
   Subjective:    Patient ID: Zachary Gallegos, male    DOB: [birth year], 65 y.o.   MRN: [id number]  HPI The patient is a 65 YO man coming in for wellness. No concerns. Non-smoker.  PMH, [facility], social history reviewed and updated.   Review of Systems  Constitutional: Negative for fever, activity change, appetite change, fatigue and unexpected weight change.  HENT: Negative.   Respiratory: Negative for cough, chest tightness, shortness of breath and wheezing.   Cardiovascular: Negative for chest pain, palpitations and leg swelling.  Gastrointestinal: Negative for abdominal pain, diarrhea, constipation, blood in stool and abdominal distention.  Musculoskeletal: Negative.   Skin: Negative.   Neurological: Negative.   Psychiatric/Behavioral: Negative.       Objective:   Physical Exam  Constitutional: He is oriented to person, place, and time. He appears well-developed and well-nourished.  HENT:  Head: Normocephalic and atraumatic.  Eyes: EOM are normal.  Neck: Normal range of motion.  Cardiovascular: Normal rate and regular rhythm.   No murmur heard. Carotids without bruits.   Pulmonary/Chest: Effort normal and breath sounds normal. No respiratory distress. He has no wheezes. He has no rales.  Abdominal: Soft. Bowel sounds are normal. He exhibits no distension. There is no tenderness. There is no rebound.  Musculoskeletal: He exhibits no edema.  Neurological: He is alert and oriented to person, place, and time.  Skin: Skin is warm and dry.  Psychiatric: He has a normal mood and affect. His behavior is normal.   Filed Vitals:   04/28/15 0830  BP: 138/62  Pulse: 74  Temp: 98.4 F (36.9 C)  TempSrc: Oral  Resp: 12  Height: 6\' 1"  (1.854 m)  Weight: 195 lb (88.451 kg)  SpO2: 97%   EKG: Rate 63, axis okay, intervals normal, no ST or T wave changes, no prior to compare.     Assessment & Plan:

## 2015-04-29 LAB — HEPATITIS C ANTIBODY: HCV Ab: NEGATIVE

## 2015-08-20 ENCOUNTER — Emergency Department (HOSPITAL_COMMUNITY)
Admission: EM | Admit: 2015-08-20 | Discharge: 2015-08-20 | Disposition: A | Discharge status: Home / Self Care | Payer: BLUE CROSS/BLUE SHIELD | Attending: Emergency Medicine | Admitting: Emergency Medicine

## 2015-08-20 ENCOUNTER — Encounter (HOSPITAL_COMMUNITY): Payer: Self-pay | Admitting: Physical Medicine and Rehabilitation

## 2015-08-20 DIAGNOSIS — W228XXA Striking against or struck by other objects, initial encounter: Secondary | ICD-10-CM | POA: Diagnosis not present

## 2015-08-20 DIAGNOSIS — R55 Syncope and collapse: Secondary | ICD-10-CM | POA: Insufficient documentation

## 2015-08-20 DIAGNOSIS — Z7982 Long term (current) use of aspirin: Secondary | ICD-10-CM | POA: Insufficient documentation

## 2015-08-20 DIAGNOSIS — Y999 Unspecified external cause status: Secondary | ICD-10-CM | POA: Diagnosis not present

## 2015-08-20 DIAGNOSIS — Z79899 Other long term (current) drug therapy: Secondary | ICD-10-CM | POA: Insufficient documentation

## 2015-08-20 DIAGNOSIS — Y92017 Garden or yard in single-family (private) house as the place of occurrence of the external cause: Secondary | ICD-10-CM | POA: Insufficient documentation

## 2015-08-20 DIAGNOSIS — I10 Essential (primary) hypertension: Secondary | ICD-10-CM | POA: Diagnosis not present

## 2015-08-20 DIAGNOSIS — S00201A Unspecified superficial injury of right eyelid and periocular area, initial encounter: Secondary | ICD-10-CM | POA: Diagnosis present

## 2015-08-20 DIAGNOSIS — Y939 Activity, unspecified: Secondary | ICD-10-CM | POA: Diagnosis not present

## 2015-08-20 DIAGNOSIS — S01111A Laceration without foreign body of right eyelid and periocular area, initial encounter: Secondary | ICD-10-CM | POA: Diagnosis not present

## 2015-08-20 LAB — CBC WITH DIFFERENTIAL/PLATELET
BASOS PCT: 0 %
Basophils Absolute: 0 10*3/uL (ref 0.0–0.1)
EOS PCT: 0 %
Eosinophils Absolute: 0 10*3/uL (ref 0.0–0.7)
HEMATOCRIT: 35.7 % — AB (ref 39.0–52.0)
HEMOGLOBIN: 11.5 g/dL — AB (ref 13.0–17.0)
LYMPHS ABS: 1.3 10*3/uL (ref 0.7–4.0)
Lymphocytes Relative: 12 %
MCH: 19.9 pg — ABNORMAL LOW (ref 26.0–34.0)
MCHC: 32.2 g/dL (ref 30.0–36.0)
MCV: 61.9 fL — ABNORMAL LOW (ref 78.0–100.0)
MONO ABS: 0.4 10*3/uL (ref 0.1–1.0)
Monocytes Relative: 4 %
NEUTROS ABS: 8.8 10*3/uL — AB (ref 1.7–7.7)
Neutrophils Relative %: 84 %
Platelets: 219 10*3/uL (ref 150–400)
RBC: 5.77 MIL/uL (ref 4.22–5.81)
RDW: 16 % — ABNORMAL HIGH (ref 11.5–15.5)
WBC: 10.5 10*3/uL (ref 4.0–10.5)

## 2015-08-20 LAB — BASIC METABOLIC PANEL
ANION GAP: 8 (ref 5–15)
BUN: 19 mg/dL (ref 6–20)
CHLORIDE: 102 mmol/L (ref 101–111)
CO2: 27 mmol/L (ref 22–32)
CREATININE: 1.17 mg/dL (ref 0.61–1.24)
Calcium: 10.5 mg/dL — ABNORMAL HIGH (ref 8.9–10.3)
GFR calc non Af Amer: >60 mL/min (ref >60)
Glucose, Bld: 102 mg/dL — ABNORMAL HIGH (ref 65–99)
POTASSIUM: 4.9 mmol/L (ref 3.5–5.1)
SODIUM: 137 mmol/L (ref 135–145)

## 2015-08-20 MED ORDER — LIDOCAINE HCL (PF) 1 % IJ SOLN
5.0000 mL | Freq: Once | INTRAMUSCULAR | Status: AC
  Administered 2015-08-20: 5 mL
  Filled 2015-08-20: qty 5

## 2015-08-20 NOTE — ED Notes (Signed)
Pt to ED via [facility]. Pt was at home doing yard work. Over exerted himself and passed out. Pt does not remember exact events. Wife reports him being fatigued and diaphoretic. Pt is alert and oriented x4 upon arrival to ED. 20g L hand.

## 2015-08-20 NOTE — Discharge Instructions (Signed)
Please put antibiotic ointment for few days over the eyelid- or put vaseline. The sutures will dissolve in 5 days or so. Please do not rub the eyelid or submerge in water.  Please stay hydrated and do not over-exert If you experience passing out again, then come back to the ER or follow up with your PCP.

## 2015-08-20 NOTE — ED Notes (Signed)
Skin tear noted to R eyelid, bleeding controlled. Pt states he struck R eye with cup he was holding when he fell.

## 2015-08-20 NOTE — ED Provider Notes (Signed)
CSN: [id number]     Arrival date & time 08/20/15  1206 History   First MD Initiated Contact with Patient 08/20/15 1315     Chief Complaint  Patient presents with  . Loss of Consciousness     (Consider location/radiation/quality/duration/timing/severity/associated sxs/prior Treatment) Patient is a 65 y.o. male presenting with syncope.  Loss of Consciousness Associated symptoms: weakness   Associated symptoms: no chest pain, no confusion, no diaphoresis, no dizziness, no fever, no headaches, no nausea, no palpitations, no seizures, no shortness of breath and no vomiting      This is a 65 yo with HTN, HLD, PAD who presents with 2 syncopal episodes.  He says that he was working in the yard this morning and felt dehydrated, but kept working, and then he went in the house, got a cup of water and then went in the kitchen, but while on the way, he "passed out". He does not recall what happened at that time, but fell face forward and bruised his right eye lid.  Wife was on the upper floor of the house and heard a crash so went downstairs. She says she heard him say a few words. She denies any seizure like activity, any urinary incontinence. Patient denies any preceding chest pain, shortness of breath or palpitations. He was alert after about a minute or so.   Then wife sat him up on the chair, and he passed out again on the left side, and then immediately regained consciousness. Again no preceding diaphoresis, pallor, headaches, and no seizure like activity except some twitching of the arms. Denies any post-ictal confusion.   Patient said that in the morning he only had 2 cups of coffee and did not feel that he kept himself dehydrated.   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   Past Medical History  Diagnosis Date  . Hyperlipidemia   . Hypertension   . Thalassanemia   . Near syncope     with extensive cardiac evaluation- nuc stress '10   Past Surgical History   Procedure Laterality Date  . Uvulopalatopharyngoplasty, tonsillectomy and septoplasty    . Basal cell carcinoma excision  03/2013    face   Family History  Problem Relation Age of Onset  . Bipolar disorder Mother   . Mental illness Mother   . Hyperlipidemia Father   . Heart disease Father   . Cancer Neg Hx   . COPD Neg Hx   . Diabetes Neg Hx    Social History  Substance Use Topics  . Smoking status: Never Smoker   . Smokeless tobacco: Never Used  . Alcohol Use: 2.5 oz/week    5 drink(s) per week    Review of Systems  Constitutional: Positive for fatigue. Negative for fever, chills, diaphoresis, activity change and appetite change.  Eyes: Positive for pain.  Respiratory: Negative for cough, chest tightness, shortness of breath and wheezing.   Cardiovascular: Positive for syncope. Negative for chest pain and palpitations.  Gastrointestinal: Negative for nausea, vomiting and abdominal pain.  Neurological: Positive for syncope and weakness. Negative for dizziness, tremors, seizures, light-headedness, numbness and headaches.  Psychiatric/Behavioral: Negative for confusion.      Allergies  Sulfamethoxazole-trimethoprim  Home Medications   Prior to Admission medications   Medication Sig Start Date End Date Taking? Authorizing Provider  Ascorbic Acid (VITAMIN C) 1000 MG tablet Take 1,000 mg by mouth daily.      Historical Provider, MD  aspirin 325 MG EC tablet Take  81 mg by mouth daily.     Historical Provider, MD  Coenzyme Q10 50 MG CAPS Take 1 capsule by mouth at bedtime.    Historical Provider, MD  enalapril (VASOTEC) 10 MG tablet Take 1 tablet (10 mg total) by mouth daily. 04/28/15   Hoyt Koch, MD  MULTIPLE VITAMIN PO Take by mouth daily.      Historical Provider, MD  Omega-3 Fatty Acids (FISH OIL) 1000 MG CAPS Take 3 capsules by mouth daily.    Historical Provider, MD  zolpidem (AMBIEN) 10 MG tablet Take 1 tablet (10 mg total) by mouth at bedtime as needed.  04/28/15   Hoyt Koch, MD   BP 161/100 mmHg  Pulse 74  Temp(Src) 98.8 F (37.1 C) (Oral)  Resp 18  Ht 6\' 1"  (1.854 m)  Wt 87.544 kg  BMI 25.47 kg/m2  SpO2 99% Physical Exam  Constitutional: He is oriented to person, place, and time. He appears well-developed and well-nourished.  HENT:  Head: Normocephalic and atraumatic.  Right upper eyelid has a laceration about 0.5 cm in diameter.   No obvious head injury   Eyes: EOM are normal. Pupils are equal, round, and reactive to light.  Cardiovascular: Normal rate and regular rhythm.  Exam reveals no gallop and no friction rub.   No murmur heard. Pulmonary/Chest: Effort normal and breath sounds normal. No respiratory distress. He has no wheezes.  Abdominal: Soft. Bowel sounds are normal. He exhibits no distension. There is no tenderness.  Musculoskeletal: Normal range of motion.  Neurological: He is alert and oriented to person, place, and time. No cranial nerve deficit or sensory deficit.  5/5 strength in upper and lower extremities     ED Course  Procedures (including critical care time) Labs Review Labs Reviewed  CBC WITH DIFFERENTIAL/PLATELET  BASIC METABOLIC PANEL    Imaging Review No results found. I have personally reviewed and evaluated these images and lab results as part of my medical decision-making.   EKG Interpretation   Date/Time:  Sunday August 20 2015 12:18:51 EDT Ventricular Rate:  72 PR Interval:  180 QRS Duration: 100 QT Interval:  378 QTC Calculation: 414 R Axis:   6 Text Interpretation:  Sinus rhythm Normal ECG No previous tracing  Confirmed by KNOTT MD, DANIEL [id number]) on 08/20/2015 12:26:39 PM      MDM   Final diagnoses:  None    65 yo who presents with 2 syncopal episodes after over-exerting himself in the morning.  Vasovagal syncope vs orthostatic. Low likelihood of cardiac etiology- his EKG is unremarkable, and patient has no history of arrhythmias. Unlikely to be  seizures.  -Obtained CBC and BMET. CBC and BMET are unremarkable. Has history of chronic anemia, likely due to thalassemia.   Patient asked to keep himself hydrated and not to over-exert and work in the heat. Asked to follow up with PCP. -Also sutured the laceration on the right upper eye-lid.     Burgess Estelle, MD 08/24/15 1303  Leo Grosser, MD 08/24/15 [phone]

## 2015-08-22 NOTE — ED Provider Notes (Signed)
I saw and evaluated the patient, reviewed the resident's note and I agree with the findings and plan. Please see associated encounter note.   EKG Interpretation   Date/Time:  Sunday August 20 2015 12:18:51 EDT Ventricular Rate:  72 PR Interval:  180 QRS Duration: 100 QT Interval:  378 QTC Calculation: 414 R Axis:   6 Text Interpretation:  Sinus rhythm Normal ECG No previous tracing  Confirmed by Julena Barbour MD, Vicktoria Muckey ([id number]) on 08/20/2015 12:26:39 PM      65  y.o. male presents with multiple syncopal episodes after working outside in the heat and not drinking water. He became diaphoretic and collapsed twice in succession. He drank water and was given a small IVF bolus by EMS prior to arrival. Symptoms have completely resolved, AFVSS. 1cm laceration of right upper eyelid was approximated and wound care recommendations made. EKG interpreted by me without ST or T wave changes concerning for myocardial ischemia. No delta wave, no prolonged QTc, no brugada to suggest arrhythmogenicity.  Low risk by SF syncope rule with stable Hct. Plan to follow up with PCP as needed and return precautions discussed for worsening or new concerning symptoms.    LACERATION REPAIR Performed by: Leo Grosser Authorized by: Leo Grosser Consent: Verbal consent obtained. Risks and benefits: risks, benefits and alternatives were discussed Consent given by: patient Patient identity confirmed: provided demographic data Prepped and Draped in normal sterile fashion Wound explored  Laceration Location: right upper eyelid  Laceration Length: 1 cm  No Foreign Bodies seen or palpated  Anesthesia: local infiltration  Local anesthetic: lidocaine 1% wo epinephrine  Anesthetic total: 1 ml  Irrigation method: syringe Amount of cleaning: standard  Skin closure: 5-0 fast gut  Number of sutures: 2  Technique: simple interrupted  Patient tolerance: Patient tolerated the procedure well with no immediate  complications.   Leo Grosser, MD 08/22/15 1246

## 2016-03-21 ENCOUNTER — Encounter: Payer: Self-pay | Admitting: Gastroenterology

## 2016-05-14 ENCOUNTER — Telehealth: Payer: Self-pay | Admitting: Internal Medicine

## 2016-05-16 ENCOUNTER — Encounter: Payer: Self-pay | Admitting: Internal Medicine

## 2016-05-16 NOTE — Telephone Encounter (Signed)
Patient called in very mad about not getting 90 day supply's of this medication. Yet the patient has not been here in over a year and they gave him 30 days to make an cpe. He would not be able to come in for a cpe on the times I gave him due to being out of town. I explained over and over he had to have an appointment to get his rx. He said he needed a new doctor if we could not give him 30 days because we dont care about him if that is the cause because we make him throw 10$ away doing it this way. I did not know what else to do for him.??

## 2016-05-20 NOTE — Telephone Encounter (Signed)
Patient contacted and stated awareness 

## 2016-05-20 NOTE — Telephone Encounter (Signed)
We can give another 30 day supply if needed until physical.

## 2016-05-24 ENCOUNTER — Encounter: Payer: BLUE CROSS/BLUE SHIELD | Admitting: Internal Medicine

## 2016-06-10 ENCOUNTER — Encounter: Payer: Self-pay | Admitting: Internal Medicine

## 2016-06-10 ENCOUNTER — Other Ambulatory Visit: Payer: Self-pay | Admitting: Internal Medicine

## 2016-06-10 MED ORDER — ENALAPRIL MALEATE 10 MG PO TABS: 10.0000 mg | ORAL_TABLET | Freq: Every day | ORAL | 0 refills | Status: DC

## 2016-06-18 DIAGNOSIS — D235 Other benign neoplasm of skin of trunk: Secondary | ICD-10-CM | POA: Diagnosis not present

## 2016-06-18 DIAGNOSIS — L814 Other melanin hyperpigmentation: Secondary | ICD-10-CM | POA: Diagnosis not present

## 2016-06-18 DIAGNOSIS — L918 Other hypertrophic disorders of the skin: Secondary | ICD-10-CM | POA: Diagnosis not present

## 2016-06-18 DIAGNOSIS — L821 Other seborrheic keratosis: Secondary | ICD-10-CM | POA: Diagnosis not present

## 2016-06-18 DIAGNOSIS — D229 Melanocytic nevi, unspecified: Secondary | ICD-10-CM | POA: Diagnosis not present

## 2016-06-18 DIAGNOSIS — D1801 Hemangioma of skin and subcutaneous tissue: Secondary | ICD-10-CM | POA: Diagnosis not present

## 2016-06-18 DIAGNOSIS — D225 Melanocytic nevi of trunk: Secondary | ICD-10-CM | POA: Diagnosis not present

## 2016-06-18 DIAGNOSIS — Z85828 Personal history of other malignant neoplasm of skin: Secondary | ICD-10-CM | POA: Diagnosis not present

## 2016-06-18 DIAGNOSIS — D2261 Melanocytic nevi of right upper limb, including shoulder: Secondary | ICD-10-CM | POA: Diagnosis not present

## 2016-06-18 DIAGNOSIS — D485 Neoplasm of uncertain behavior of skin: Secondary | ICD-10-CM | POA: Diagnosis not present

## 2016-08-30 ENCOUNTER — Encounter: Payer: Self-pay | Admitting: Internal Medicine

## 2016-09-03 ENCOUNTER — Encounter: Payer: BLUE CROSS/BLUE SHIELD | Admitting: Internal Medicine

## 2016-09-23 DIAGNOSIS — I10 Essential (primary) hypertension: Secondary | ICD-10-CM | POA: Diagnosis not present

## 2016-09-23 DIAGNOSIS — G4733 Obstructive sleep apnea (adult) (pediatric): Secondary | ICD-10-CM | POA: Diagnosis not present

## 2016-09-23 DIAGNOSIS — Z0000 Encounter for general adult medical examination without abnormal findings: Secondary | ICD-10-CM | POA: Diagnosis not present

## 2016-09-23 DIAGNOSIS — K429 Umbilical hernia without obstruction or gangrene: Secondary | ICD-10-CM | POA: Diagnosis not present

## 2016-09-23 DIAGNOSIS — Z6826 Body mass index (BMI) 26.0-26.9, adult: Secondary | ICD-10-CM | POA: Diagnosis not present

## 2016-09-23 DIAGNOSIS — M545 Low back pain: Secondary | ICD-10-CM | POA: Diagnosis not present

## 2016-09-23 DIAGNOSIS — E784 Other hyperlipidemia: Secondary | ICD-10-CM | POA: Diagnosis not present

## 2016-09-23 DIAGNOSIS — Z1389 Encounter for screening for other disorder: Secondary | ICD-10-CM | POA: Diagnosis not present

## 2016-09-23 DIAGNOSIS — G6289 Other specified polyneuropathies: Secondary | ICD-10-CM | POA: Diagnosis not present

## 2016-09-23 DIAGNOSIS — G629 Polyneuropathy, unspecified: Secondary | ICD-10-CM | POA: Diagnosis not present

## 2016-09-23 DIAGNOSIS — Z23 Encounter for immunization: Secondary | ICD-10-CM | POA: Diagnosis not present

## 2016-10-23 ENCOUNTER — Encounter: Payer: Self-pay | Admitting: Gastroenterology

## 2016-11-15 ENCOUNTER — Ambulatory Visit (INDEPENDENT_AMBULATORY_CARE_PROVIDER_SITE_OTHER): Payer: Medicare Other | Admitting: Internal Medicine

## 2016-11-15 DIAGNOSIS — Z789 Other specified health status: Secondary | ICD-10-CM

## 2016-11-15 DIAGNOSIS — Z9189 Other specified personal risk factors, not elsewhere classified: Secondary | ICD-10-CM

## 2016-11-15 DIAGNOSIS — Z7184 Encounter for health counseling related to travel: Secondary | ICD-10-CM

## 2016-11-15 DIAGNOSIS — Z23 Encounter for immunization: Secondary | ICD-10-CM

## 2016-11-15 DIAGNOSIS — Z7189 Other specified counseling: Secondary | ICD-10-CM

## 2016-11-15 MED ORDER — CIPROFLOXACIN HCL 500 MG PO TABS: 500.0000 mg | ORAL_TABLET | Freq: Two times a day (BID) | ORAL | 0 refills | Status: DC

## 2016-11-15 MED ORDER — ATOVAQUONE-PROGUANIL HCL 250-100 MG PO TABS: 1.0000 | ORAL_TABLET | Freq: Every day | ORAL | 0 refills | Status: DC

## 2016-11-15 NOTE — Patient Instructions (Signed)
[facility]                [address], [place]                   [place], [place] [address]                      Phone: [phone]                        Fax: [phone]   Planned departure date: January 2019          Planned return date: 2 weeks Countries of travel: [place]   Guidelines for the Prevention & Treatment of Traveler's Diarrhea  Prevention: "Boil it, Peel it, Lacinda Axon it, or Forget it"   the fewer chances -> lower risk: try to stick to food & water precautions as much as possible"   If it's "piping hot"; it is probably okay, if not, it may not be   Treatment   1) You should always take care to drink lots of fluids in order to avoid dehydration   2) You should bring medications with you in case you come down with a case of diarrhea   3) OTC = bring pepto-bismol - can take with initial abdominal symptoms;                    Imodium - can help slow down your intestinal tract, can help relief cramps                    and diarrhea, can take if no bloody diarrhea  Use ciprofloxacin if needed for traveler's diarrhea  Guidelines for the Prevention of Malaria  Avoidance:  -fewer mosquito bites = lower risk. Mosquitos can bite at night as well as daytime  -cover up (long sleeve clothing), mosquito nets, screens  -Insect repellent for your skin ( DEET containing lotion > 20%): for clothes ( permethrin spray)   2 days prior to travel, start malarone, daily dose starting 1-2 days before entering endemic area, ending 7 days after leaving area for malaria prevention.   Immunizations received today: Hepatitis A series and Typhoid (parenteral)  Future immunizations, if indicated Hepatitis A series after May 15, 2017    Prior to travel:  1) Be sure to pick up appropriate prescriptions, including medicine you take daily. Do not expect to be able to fill your prescriptions abroad.  2) Strongly consider obtaining traveler's  insurance, including emergency evacuation insurance. Most plans in the [place] do not cover participants abroad. (see below for resources)  3) Register at the appropriate [place] embassy or consulate with travel dates so they are aware of your presence in-country and for helpful advice during travel using the [employer] (STEP, GreenNylon.com.cy).  4) Leave contact information with a relative or friend.  5) Keep a Research officer, political party, credit cards in case they become lost or stolen  6) Inform your credit card company that you will be travelling abroad   During travel:  1) If you become ill and need medical advice, the [place] [employer]	[place] of the country you are traveling in general provides a list of [place] speaking doctors.  We are also available on MyChart for remote consultation if you register prior to travel. 2) Avoid motorcycles or scooters when at all possible. Traffic laws in many countries are  lax and accidents occur frequently.  3) Do not take any unnecessary risks that you wouldn't do at home.   Resources:  -Country specific information: BlindResource.ca or GreenNylon.com.cy  -Press photographer (DEET, mosquito nets): [employer], [employer] store, [employer], Cesar Chavez insurance options: gatewayplans.com; http://clayton-rivera.info/; travelguard.com or Good [employer], gninsurance.com or info@gninsurance .com, [id number].   Post Travel:  If you return from your trip ill, call your primary care doctor or our travel clinic @ [phone].   Enjoy your trip and know that with proper pre-travel preparation, most people have an enjoyable and uninterrupted trip!

## 2016-11-18 NOTE — Progress Notes (Signed)
Subjective:   Beaumont Austad is a 66 y.o. male who presents to the Infectious Disease clinic for travel consultation. Planned departure date: January 2019          Planned return date: 2 weeks Countries of travel: [place] Areas in country: [place]   Accommodations: hotel Purpose of travel: vacation Prior travel out of [place]: yes     Objective:   Medications: reviewed    Assessment:   No contraindications to travel. none     Plan:    Issues discussed: environmental concerns, freshwater swimming, future shots, insect-borne illnesses, malaria, MVA safety, rabies, safe food/water, traveler's diarrhea, website/handouts for more information, what to do if ill upon return, what to do if ill while there and Yellow Fever. Immunizations recommended: Hepatitis A series and Typhoid (parenteral). Malaria prophylaxis: malarone, daily dose starting 1-2 days before entering endemic area, ending 7 days after leaving area Traveler's diarrhea prophylaxis: ciprofloxacin. Total duration of visit: 1 Hour. Total time spent on education, counseling, coordination of care: 30 Minutes.

## 2016-12-18 ENCOUNTER — Ambulatory Visit (AMBULATORY_SURGERY_CENTER): Payer: Self-pay | Admitting: *Deleted

## 2016-12-18 VITALS — Ht 73.0 in | Wt 197.0 lb

## 2016-12-18 DIAGNOSIS — Z23 Encounter for immunization: Secondary | ICD-10-CM | POA: Diagnosis not present

## 2016-12-18 DIAGNOSIS — Z1211 Encounter for screening for malignant neoplasm of colon: Secondary | ICD-10-CM

## 2016-12-18 MED ORDER — NA SULFATE-K SULFATE-MG SULF 17.5-3.13-1.6 GM/177ML PO SOLN: 1.0000 | Freq: Once | ORAL | 0 refills | Status: AC

## 2016-12-18 NOTE — Progress Notes (Signed)
No egg or soy allergy known to patient  No issues with past sedation with any surgeries  or procedures, no intubation problems  No diet pills per patient No home 02 use per patient  No blood thinners per patient  Pt denies issues with constipation  No A fib or A flutter  EMMI video sent to pt's e mail - pt declined  Pt states wife had a colon several months ago and prep was over 90$- gave pt a PNM than $50 coupon for prep in pV today

## 2017-01-01 ENCOUNTER — Encounter: Payer: Self-pay | Admitting: Gastroenterology

## 2017-01-01 ENCOUNTER — Ambulatory Visit (AMBULATORY_SURGERY_CENTER): Payer: Medicare Other | Admitting: Gastroenterology

## 2017-01-01 VITALS — BP 119/81 | HR 53 | Temp 95.5°F | Resp 16 | Ht 73.0 in | Wt 195.0 lb

## 2017-01-01 DIAGNOSIS — Z1211 Encounter for screening for malignant neoplasm of colon: Secondary | ICD-10-CM | POA: Diagnosis not present

## 2017-01-01 DIAGNOSIS — K6389 Other specified diseases of intestine: Secondary | ICD-10-CM

## 2017-01-01 DIAGNOSIS — I10 Essential (primary) hypertension: Secondary | ICD-10-CM | POA: Diagnosis not present

## 2017-01-01 DIAGNOSIS — D122 Benign neoplasm of ascending colon: Secondary | ICD-10-CM

## 2017-01-01 DIAGNOSIS — Z1212 Encounter for screening for malignant neoplasm of rectum: Secondary | ICD-10-CM

## 2017-01-01 DIAGNOSIS — D124 Benign neoplasm of descending colon: Secondary | ICD-10-CM

## 2017-01-01 MED ORDER — SODIUM CHLORIDE 0.9 % IV SOLN: 500.0000 mL | INTRAVENOUS | Status: DC

## 2017-01-01 NOTE — Patient Instructions (Signed)
  Handout given: Diverticulosis and Polyps.   YOU HAD AN ENDOSCOPIC PROCEDURE TODAY AT [facility]:   Refer to the procedure report that was given to you for any specific questions about what was found during the examination.  If the procedure report does not answer your questions, please call your gastroenterologist to clarify.  If you requested that your care partner not be given the details of your procedure findings, then the procedure report has been included in a sealed envelope for you to review at your convenience later.  YOU SHOULD EXPECT: Some feelings of bloating in the abdomen. Passage of more gas than usual.  Walking can help get rid of the air that was put into your GI tract during the procedure and reduce the bloating. If you had a lower endoscopy (such as a colonoscopy or flexible sigmoidoscopy) you may notice spotting of blood in your stool or on the toilet paper. If you underwent a bowel prep for your procedure, you may not have a normal bowel movement for a few days.  Please Note:  You might notice some irritation and congestion in your nose or some drainage.  This is from the oxygen used during your procedure.  There is no need for concern and it should clear up in a day or so.  SYMPTOMS TO REPORT IMMEDIATELY:   Following lower endoscopy (colonoscopy or flexible sigmoidoscopy):  Excessive amounts of blood in the stool  Significant tenderness or worsening of abdominal pains  Swelling of the abdomen that is new, acute  Fever of 100F or higher   For urgent or emergent issues, a gastroenterologist can be reached at any hour by calling [phone].   DIET:  We do recommend a small meal at first, but then you may proceed to your regular diet.  Drink plenty of fluids but you should avoid alcoholic beverages for 24 hours.  ACTIVITY:  You should plan to take it easy for the rest of today and you should NOT DRIVE or use heavy machinery until tomorrow (because of  the sedation medicines used during the test).    FOLLOW UP: Our staff will call the number listed on your records the next business day following your procedure to check on you and address any questions or concerns that you may have regarding the information given to you following your procedure. If we do not reach you, we will leave a message.  However, if you are feeling well and you are not experiencing any problems, there is no need to return our call.  We will assume that you have returned to your regular daily activities without incident.  If any biopsies were taken you will be contacted by phone or by letter within the next 1-3 weeks.  Please call us at [phone] if you have not heard about the biopsies in 3 weeks.    SIGNATURES/CONFIDENTIALITY: You and/or your care partner have signed paperwork which will be entered into your electronic medical record.  These signatures attest to the fact that that the information above on your After Visit Summary has been reviewed and is understood.  Full responsibility of the confidentiality of this discharge information lies with you and/or your care-partner.

## 2017-01-01 NOTE — Op Note (Signed)
[facility] Patient Name: Zachary Gallegos Procedure Date: 01/01/2017 1:24 PM MRN: [id number] Endoscopist: Mallie Mussel L. Loletha Carrow , MD Age: 66 Referring MD:  Date of Birth: 01-19-1951 Gender: Male Account #: [id number] Procedure:                Colonoscopy Indications:              Screening for colorectal malignant neoplasm (no                            polyps on 03/2006 colonoscopy) Medicines:                Monitored Anesthesia Care Procedure:                Pre-Anesthesia Assessment:                           - Prior to the procedure, a History and Physical                            was performed, and patient medications and                            allergies were reviewed. The patient's tolerance of                            previous anesthesia was also reviewed. The risks                            and benefits of the procedure and the sedation                            options and risks were discussed with the patient.                            All questions were answered, and informed consent                            was obtained. Anticoagulants: The patient has taken                            aspirin. It was decided not to withhold this                            medication prior to the procedure. ASA Grade                            Assessment: II - A patient with mild systemic                            disease. After reviewing the risks and benefits,                            the patient was deemed in satisfactory condition to  undergo the procedure.                           After obtaining informed consent, the colonoscope                            was passed under direct vision. Throughout the                            procedure, the patient's blood pressure, pulse, and                            oxygen saturations were monitored continuously. The                            Colonoscope was introduced through the anus and                       advanced to the the cecum, identified by                            appendiceal orifice and ileocecal valve. The                            colonoscopy was performed without difficulty. The                            patient tolerated the procedure well. The quality                            of the bowel preparation was good. The ileocecal                            valve, appendiceal orifice, and rectum were                            photographed. The quality of the bowel preparation                            was evaluated using the BBPS [facility] Bowel                            Preparation Scale) with scores of: Right Colon = 2,                            Transverse Colon = 3 and Left Colon = 2. The total                            BBPS score equals 7. The bowel preparation used was                            SUPREP. Scope In: 1:36:30 PM Scope Out: 1:52:59 PM Scope Withdrawal Time: 0 hours 9 minutes 1 second  Total Procedure Duration: 0 hours 16 minutes 29 seconds  Findings:                 The perianal and digital rectal examinations were                            normal.                           Multiple small-mouthed diverticula were found in                            the left colon.                           Two sessile polyps were found in the proximal                            descending colon and proximal ascending colon. The                            polyps were 2 to 4 mm in size. These polyps were                            removed with a cold biopsy forceps. Resection and                            retrieval were complete.                           The exam was otherwise without abnormality on                            direct and retroflexion views. Complications:            No immediate complications. Estimated Blood Loss:     Estimated blood loss: none. Impression:               - Diverticulosis in the left colon.                           - Two  2 to 4 mm polyps in the proximal descending                            colon and in the proximal ascending colon, removed                            with a cold biopsy forceps. Resected and retrieved.                           - The examination was otherwise normal on direct                            and retroflexion views. Recommendation:           - Patient has a contact number available for  emergencies. The signs and symptoms of potential                            delayed complications were discussed with the                            patient. Return to normal activities tomorrow.                            Written discharge instructions were provided to the                            patient.                           - Resume previous diet.                           - Continue present medications.                           - Await pathology results.                           - Repeat colonoscopy is recommended for                            surveillance. The colonoscopy date will be                            determined after pathology results from today's                            exam become available for review. Antonieta Slaven L. Loletha Carrow, MD 01/01/2017 1:57:36 PM This report has been signed electronically.

## 2017-01-01 NOTE — Progress Notes (Signed)
Called to room to assist during endoscopic procedure.  Patient ID and intended procedure confirmed with present staff. Received instructions for my participation in the procedure from the performing physician. maw 

## 2017-01-01 NOTE — Progress Notes (Signed)
To recovery, report to RN, VSS. 

## 2017-01-02 ENCOUNTER — Telehealth: Payer: Self-pay | Admitting: *Deleted

## 2017-01-02 NOTE — Telephone Encounter (Signed)
  Follow up Call-  Call back number 01/01/2017  Post procedure Call Back phone  # [phone]  Permission to leave phone message Yes  Some recent data might be hidden     Patient questions:  Do you have a fever, pain , or abdominal swelling? No. Pain Score  0 *  Have you tolerated food without any problems? Yes.    Have you been able to return to your normal activities? Yes.    Do you have any questions about your discharge instructions: Diet   No. Medications  No. Follow up visit  No.  Do you have questions or concerns about your Care? No.  Actions: * If pain score is 4 or above: No action needed, pain <4.

## 2017-01-08 ENCOUNTER — Encounter: Payer: Self-pay | Admitting: Gastroenterology

## 2017-02-19 ENCOUNTER — Encounter: Payer: Self-pay | Admitting: Internal Medicine

## 2017-03-10 ENCOUNTER — Encounter: Payer: Self-pay | Admitting: Internal Medicine

## 2017-04-14 ENCOUNTER — Encounter: Payer: Self-pay | Admitting: Internal Medicine

## 2017-04-30 DIAGNOSIS — E7849 Other hyperlipidemia: Secondary | ICD-10-CM | POA: Diagnosis not present

## 2017-04-30 DIAGNOSIS — R82998 Other abnormal findings in urine: Secondary | ICD-10-CM | POA: Diagnosis not present

## 2017-04-30 DIAGNOSIS — Z125 Encounter for screening for malignant neoplasm of prostate: Secondary | ICD-10-CM | POA: Diagnosis not present

## 2017-04-30 DIAGNOSIS — I10 Essential (primary) hypertension: Secondary | ICD-10-CM | POA: Diagnosis not present

## 2017-05-07 DIAGNOSIS — T783XXA Angioneurotic edema, initial encounter: Secondary | ICD-10-CM | POA: Diagnosis not present

## 2017-05-07 DIAGNOSIS — I10 Essential (primary) hypertension: Secondary | ICD-10-CM | POA: Diagnosis not present

## 2017-05-07 DIAGNOSIS — G6289 Other specified polyneuropathies: Secondary | ICD-10-CM | POA: Diagnosis not present

## 2017-05-07 DIAGNOSIS — M25562 Pain in left knee: Secondary | ICD-10-CM | POA: Diagnosis not present

## 2017-05-07 DIAGNOSIS — Z1389 Encounter for screening for other disorder: Secondary | ICD-10-CM | POA: Diagnosis not present

## 2017-05-07 DIAGNOSIS — E7849 Other hyperlipidemia: Secondary | ICD-10-CM | POA: Diagnosis not present

## 2017-05-07 DIAGNOSIS — Z0000 Encounter for general adult medical examination without abnormal findings: Secondary | ICD-10-CM | POA: Diagnosis not present

## 2017-05-07 DIAGNOSIS — D563 Thalassemia minor: Secondary | ICD-10-CM | POA: Diagnosis not present

## 2017-05-07 DIAGNOSIS — G4733 Obstructive sleep apnea (adult) (pediatric): Secondary | ICD-10-CM | POA: Diagnosis not present

## 2017-05-07 DIAGNOSIS — D508 Other iron deficiency anemias: Secondary | ICD-10-CM | POA: Diagnosis not present

## 2017-05-07 DIAGNOSIS — Z6826 Body mass index (BMI) 26.0-26.9, adult: Secondary | ICD-10-CM | POA: Diagnosis not present

## 2017-05-07 DIAGNOSIS — R740 Nonspecific elevation of levels of transaminase and lactic acid dehydrogenase [LDH]: Secondary | ICD-10-CM | POA: Diagnosis not present

## 2017-05-08 ENCOUNTER — Other Ambulatory Visit: Payer: Self-pay | Admitting: Internal Medicine

## 2017-05-08 DIAGNOSIS — Z1212 Encounter for screening for malignant neoplasm of rectum: Secondary | ICD-10-CM | POA: Diagnosis not present

## 2017-05-08 DIAGNOSIS — E785 Hyperlipidemia, unspecified: Secondary | ICD-10-CM

## 2017-05-16 ENCOUNTER — Ambulatory Visit
Admission: RE | Admit: 2017-05-16 | Discharge: 2017-05-16 | Disposition: A | Discharge status: Home / Self Care | Payer: Medicare Other | Source: Ambulatory Visit | Attending: Internal Medicine | Admitting: Internal Medicine

## 2017-05-16 DIAGNOSIS — I2510 Atherosclerotic heart disease of native coronary artery without angina pectoris: Secondary | ICD-10-CM | POA: Diagnosis not present

## 2017-05-16 DIAGNOSIS — E785 Hyperlipidemia, unspecified: Secondary | ICD-10-CM | POA: Diagnosis not present

## 2017-05-23 ENCOUNTER — Other Ambulatory Visit: Payer: Medicare Other

## 2017-05-23 ENCOUNTER — Other Ambulatory Visit: Payer: Self-pay | Admitting: Internal Medicine

## 2017-05-23 DIAGNOSIS — K769 Liver disease, unspecified: Secondary | ICD-10-CM

## 2017-05-26 ENCOUNTER — Other Ambulatory Visit: Payer: Medicare Other

## 2017-06-02 ENCOUNTER — Ambulatory Visit
Admission: RE | Admit: 2017-06-02 | Discharge: 2017-06-02 | Disposition: A | Discharge status: Home / Self Care | Payer: Medicare Other | Source: Ambulatory Visit | Attending: Internal Medicine | Admitting: Internal Medicine

## 2017-06-02 DIAGNOSIS — K769 Liver disease, unspecified: Secondary | ICD-10-CM | POA: Diagnosis not present

## 2017-06-12 DIAGNOSIS — I2510 Atherosclerotic heart disease of native coronary artery without angina pectoris: Secondary | ICD-10-CM | POA: Diagnosis not present

## 2017-06-12 DIAGNOSIS — E7849 Other hyperlipidemia: Secondary | ICD-10-CM | POA: Diagnosis not present

## 2017-06-12 DIAGNOSIS — Z6826 Body mass index (BMI) 26.0-26.9, adult: Secondary | ICD-10-CM | POA: Diagnosis not present

## 2017-06-18 ENCOUNTER — Encounter: Payer: Self-pay | Admitting: Cardiovascular Disease

## 2017-06-18 ENCOUNTER — Ambulatory Visit (INDEPENDENT_AMBULATORY_CARE_PROVIDER_SITE_OTHER): Payer: Medicare Other | Admitting: Cardiovascular Disease

## 2017-06-18 VITALS — BP 148/86 | HR 57 | Ht 73.0 in | Wt 194.0 lb

## 2017-06-18 DIAGNOSIS — R931 Abnormal findings on diagnostic imaging of heart and coronary circulation: Secondary | ICD-10-CM | POA: Diagnosis not present

## 2017-06-18 DIAGNOSIS — E7849 Other hyperlipidemia: Secondary | ICD-10-CM

## 2017-06-18 DIAGNOSIS — R9439 Abnormal result of other cardiovascular function study: Secondary | ICD-10-CM | POA: Diagnosis not present

## 2017-06-18 NOTE — Patient Instructions (Signed)
Medication Instructions:  Your physician recommends that you continue on your current medications as directed. Please refer to the Current Medication list given to you today.  Labwork: none  Testing/Procedures: Your physician has requested that you have en exercise stress myoview. For further information please visit www.cardiosmart.org. Please follow instruction sheet, as given.    Follow-Up: Follow up with Dr. Berry as needed.   Any Other Special Instructions Will Be Listed Below (If Applicable).     If you need a refill on your cardiac medications before your next appointment, please call your pharmacy.   

## 2017-06-18 NOTE — Progress Notes (Signed)
06/18/2017 Mayford Knife   September 07, 1950  [id number]  Primary Physician Marton Redwood, MD Primary Cardiologist: Lorretta Harp MD Renae Gloss  HPI:  Zachary Gallegos is a 67 y.o. thin and fit-appearing married Caucasian male father of 30, grandfather 3 grandchildren who works part-time now as a Optometrist in the [employer]. He was referred by Dr. Brigitte Pulse for cardiovascular evaluation because of a high coronary calcium score of 653 measured on 05/16/17 with calcium in the distribution of the RCA, LAD and left main. His cardiac risk factor profile is notable for hyperlipidemia intolerant to statin therapy currently on Repatha followed by his PCP. History of hypertension and family history of the father who died of a myocardial infarction at age 25. He has never had a heart attack or stroke and denies chest pain or shortness of breath. He exercises daily on the elliptical, playing golf and walking.     Current Meds  Medication Sig  . Ascorbic Acid (VITAMIN C) 1000 MG tablet Take 1,000 mg by mouth daily.    Marland Kitchen aspirin 81 MG chewable tablet Chew 81 mg by mouth daily.  Marland Kitchen atovaquone-proguanil (MALARONE) 250-100 MG TABS tablet Take 1 tablet by mouth daily. Start 2 days prior to travel to malaria area, throughout travel and for 7 days upon return.  . B COMPLEX-C PO Take by mouth daily.  . Calcium-Vitamins C & D (CALCIUM/C/D PO) Take by mouth daily.  Marland Kitchen CINNAMON PO Take by mouth daily.  . ciprofloxacin (CIPRO) 500 MG tablet Take 1 tablet (500 mg total) by mouth 2 (two) times daily. Take 1-2 days until diarrhea resolves  . Coenzyme Q10 50 MG CAPS Take 1 capsule by mouth at bedtime.  . Evolocumab (REPATHA SURECLICK) 284 MG/ML SOAJ Inject into the skin every 21 ( twenty-one) days.  . Magnesium Oxide (MAG-200 PO) Take by mouth daily.  . MULTIPLE VITAMIN PO Take by mouth daily.    . Omega-3 Fatty Acids (FISH OIL) 1000 MG CAPS Take 3 capsules by mouth daily.  Marland Kitchen OVER THE COUNTER  MEDICATION daily. tumeric  . POTASSIUM PO Take by mouth daily.  . vitamin E 100 UNIT capsule Take 100 Units by mouth daily.  Marland Kitchen zolpidem (AMBIEN) 10 MG tablet Take 1 tablet (10 mg total) by mouth at bedtime as needed.  . [DISCONTINUED] enalapril (VASOTEC) 10 MG tablet Take 1 tablet (10 mg total) by mouth daily. Must keep appt on June for future refills     Allergies  Allergen Reactions  . Sulfamethoxazole-Trimethoprim     Social History   Socioeconomic History  . Marital status: Married    Spouse name: Not on file  . Number of children: 2  . Years of education: 59  . Highest education level: Not on file  Occupational History  . Occupation: testile executive    Comment: self-employed  Social Needs  . Financial resource strain: Not on file  . Food insecurity:    Worry: Not on file    Inability: Not on file  . Transportation needs:    Medical: Not on file    Non-medical: Not on file  Tobacco Use  . Smoking status: Never Smoker  . Smokeless tobacco: Never Used  Substance and Sexual Activity  . Alcohol use: Yes    Alcohol/week: 2.5 oz    Types: 5 Standard drinks or equivalent per week  . Drug use: No  . Sexual activity: Yes    Partners: Female  Lifestyle  . Physical activity:  Days per week: Not on file    Minutes per session: Not on file  . Stress: Not on file  Relationships  . Social connections:    Talks on phone: Not on file    Gets together: Not on file    Attends religious service: Not on file    Active member of club or organization: Not on file    Attends meetings of clubs or organizations: Not on file    Relationship status: Not on file  . Intimate partner violence:    Fear of current or ex partner: Not on file    Emotionally abused: Not on file    Physically abused: Not on file    Forced sexual activity: Not on file  Other Topics Concern  . Not on file  Social History Narrative   [place]; [place]. Married '75. 1 son  '89; 2 daughters '81, '84. Work: self empl. - textile mfg. Got a great price on a '99 mercedes sl 500 convertaible (june '11). Mother in [place] recently died ('74). Family is making appropriate adjustment.   Regular exercise: daily   Caffeine use: coffee daily           Review of Systems: General: negative for chills, fever, night sweats or weight changes.  Cardiovascular: negative for chest pain, dyspnea on exertion, edema, orthopnea, palpitations, paroxysmal nocturnal dyspnea or shortness of breath Dermatological: negative for rash Respiratory: negative for cough or wheezing Urologic: negative for hematuria Abdominal: negative for nausea, vomiting, diarrhea, bright red blood per rectum, melena, or hematemesis Neurologic: negative for visual changes, syncope, or dizziness All other systems reviewed and are otherwise negative except as noted above.    Pulse (!) 57, height 6\' 1"  (1.854 m), weight 194 lb (88 kg).  General appearance: alert and no distress Neck: no adenopathy, no carotid bruit, no JVD, supple, symmetrical, trachea midline and thyroid not enlarged, symmetric, no tenderness/mass/nodules Lungs: clear to auscultation bilaterally Heart: regular rate and rhythm, S1, S2 normal, no murmur, click, rub or gallop Extremities: extremities normal, atraumatic, no cyanosis or edema Pulses: 2+ and symmetric Skin: Skin color, texture, turgor normal. No rashes or lesions Neurologic: Alert and oriented X 3, normal strength and tone. Normal symmetric reflexes. Normal coordination and gait  EKG sinus bradycardia 57 without ST or T-wave changes. I personally reviewed this EKG.  ASSESSMENT AND PLAN:   Hyperlipidemia History of hyperlipidemia intolerant to statin therapy currently on Repatha followed by his PCP  Essential hypertension History of essential hypertension blood pressure measured today at 140/86 although slower at home when he measures it. He is on losartan. Continue current meds  at current dosing.  Agatston coronary artery calcium score greater than 400 Recent coronary calcium score of 653 with calcium in the RCA, LAD and left main. He is completely asymptomatic and exercises frequently. I am going to get an exercise Myoview stress test as a baseline given the high calcium score and diffuse nature of disease.      Lorretta Harp MD FACP,FACC,FAHA, [facility] 06/18/2017 12:23 PM

## 2017-06-18 NOTE — Assessment & Plan Note (Signed)
History of essential hypertension blood pressure measured today at 140/86 although slower at home when he measures it. He is on losartan. Continue current meds at current dosing.

## 2017-06-18 NOTE — Assessment & Plan Note (Signed)
History of hyperlipidemia intolerant to statin therapy currently on Repatha followed by his PCP

## 2017-06-18 NOTE — Assessment & Plan Note (Signed)
Recent coronary calcium score of 653 with calcium in the RCA, LAD and left main. He is completely asymptomatic and exercises frequently. I am going to get an exercise Myoview stress test as a baseline given the high calcium score and diffuse nature of disease.

## 2017-07-08 ENCOUNTER — Telehealth (HOSPITAL_COMMUNITY): Payer: Self-pay

## 2017-07-08 NOTE — Telephone Encounter (Signed)
Encounter complete. 

## 2017-07-09 DIAGNOSIS — K7689 Other specified diseases of liver: Secondary | ICD-10-CM | POA: Diagnosis not present

## 2017-07-10 ENCOUNTER — Ambulatory Visit (HOSPITAL_COMMUNITY)
Admission: RE | Admit: 2017-07-10 | Discharge: 2017-07-10 | Disposition: A | Discharge status: Home / Self Care | Payer: Medicare Other | Source: Ambulatory Visit | Attending: Cardiovascular Disease | Admitting: Cardiovascular Disease

## 2017-07-10 DIAGNOSIS — E7849 Other hyperlipidemia: Secondary | ICD-10-CM | POA: Insufficient documentation

## 2017-07-10 DIAGNOSIS — R931 Abnormal findings on diagnostic imaging of heart and coronary circulation: Secondary | ICD-10-CM | POA: Insufficient documentation

## 2017-07-10 DIAGNOSIS — R9439 Abnormal result of other cardiovascular function study: Secondary | ICD-10-CM | POA: Diagnosis not present

## 2017-07-10 LAB — MYOCARDIAL PERFUSION IMAGING
CHL CUP MPHR: 154 {beats}/min
CHL CUP NUCLEAR SDS: 1
CSEPED: 9 min
Estimated workload: 10.1 METS
Exercise duration (sec): 0 s
LV dias vol: 80 mL (ref 62–150)
LVSYSVOL: 43 mL
NUC STRESS TID: 0.81
Peak HR: 142 {beats}/min
Percent HR: 92 %
RPE: 17
Rest HR: 58 {beats}/min
SRS: 2
SSS: 3

## 2017-07-10 MED ORDER — TECHNETIUM TC 99M TETROFOSMIN IV KIT
10.2000 | PACK | Freq: Once | INTRAVENOUS | Status: AC | PRN
  Administered 2017-07-10: 10.2 via INTRAVENOUS
  Filled 2017-07-10: qty 11

## 2017-07-10 MED ORDER — TECHNETIUM TC 99M TETROFOSMIN IV KIT
30.4000 | PACK | Freq: Once | INTRAVENOUS | Status: AC | PRN
  Administered 2017-07-10: 30.4 via INTRAVENOUS
  Filled 2017-07-10: qty 31

## 2017-07-16 DIAGNOSIS — K7689 Other specified diseases of liver: Secondary | ICD-10-CM | POA: Diagnosis not present

## 2017-07-16 DIAGNOSIS — K769 Liver disease, unspecified: Secondary | ICD-10-CM | POA: Diagnosis not present

## 2017-12-01 DIAGNOSIS — Z23 Encounter for immunization: Secondary | ICD-10-CM | POA: Diagnosis not present

## 2017-12-04 ENCOUNTER — Other Ambulatory Visit: Payer: Self-pay | Admitting: Internal Medicine

## 2017-12-04 DIAGNOSIS — K769 Liver disease, unspecified: Secondary | ICD-10-CM

## 2017-12-24 ENCOUNTER — Ambulatory Visit
Admission: RE | Admit: 2017-12-24 | Discharge: 2017-12-24 | Disposition: A | Discharge status: Home / Self Care | Payer: Medicare Other | Source: Ambulatory Visit | Attending: Internal Medicine | Admitting: Internal Medicine

## 2017-12-24 DIAGNOSIS — K769 Liver disease, unspecified: Secondary | ICD-10-CM

## 2018-03-02 DIAGNOSIS — E7849 Other hyperlipidemia: Secondary | ICD-10-CM | POA: Diagnosis not present

## 2018-06-29 ENCOUNTER — Other Ambulatory Visit: Payer: Self-pay | Admitting: Internal Medicine

## 2018-06-29 DIAGNOSIS — K769 Liver disease, unspecified: Secondary | ICD-10-CM

## 2018-07-01 DIAGNOSIS — I10 Essential (primary) hypertension: Secondary | ICD-10-CM | POA: Diagnosis not present

## 2018-07-01 DIAGNOSIS — Z125 Encounter for screening for malignant neoplasm of prostate: Secondary | ICD-10-CM | POA: Diagnosis not present

## 2018-07-01 DIAGNOSIS — E7849 Other hyperlipidemia: Secondary | ICD-10-CM | POA: Diagnosis not present

## 2018-07-01 DIAGNOSIS — R82998 Other abnormal findings in urine: Secondary | ICD-10-CM | POA: Diagnosis not present

## 2018-07-07 DIAGNOSIS — Z1331 Encounter for screening for depression: Secondary | ICD-10-CM | POA: Diagnosis not present

## 2018-07-07 DIAGNOSIS — G4733 Obstructive sleep apnea (adult) (pediatric): Secondary | ICD-10-CM | POA: Diagnosis not present

## 2018-07-07 DIAGNOSIS — D563 Thalassemia minor: Secondary | ICD-10-CM | POA: Diagnosis not present

## 2018-07-07 DIAGNOSIS — R7301 Impaired fasting glucose: Secondary | ICD-10-CM | POA: Diagnosis not present

## 2018-07-07 DIAGNOSIS — Z0000 Encounter for general adult medical examination without abnormal findings: Secondary | ICD-10-CM | POA: Diagnosis not present

## 2018-07-07 DIAGNOSIS — G629 Polyneuropathy, unspecified: Secondary | ICD-10-CM | POA: Diagnosis not present

## 2018-07-07 DIAGNOSIS — K769 Liver disease, unspecified: Secondary | ICD-10-CM | POA: Diagnosis not present

## 2018-07-07 DIAGNOSIS — I10 Essential (primary) hypertension: Secondary | ICD-10-CM | POA: Diagnosis not present

## 2018-07-07 DIAGNOSIS — N529 Male erectile dysfunction, unspecified: Secondary | ICD-10-CM | POA: Diagnosis not present

## 2018-07-07 DIAGNOSIS — E785 Hyperlipidemia, unspecified: Secondary | ICD-10-CM | POA: Diagnosis not present

## 2018-07-07 DIAGNOSIS — I2510 Atherosclerotic heart disease of native coronary artery without angina pectoris: Secondary | ICD-10-CM | POA: Diagnosis not present

## 2018-07-07 DIAGNOSIS — D509 Iron deficiency anemia, unspecified: Secondary | ICD-10-CM | POA: Diagnosis not present

## 2018-09-24 ENCOUNTER — Ambulatory Visit
Admission: RE | Admit: 2018-09-24 | Discharge: 2018-09-24 | Disposition: A | Discharge status: Home / Self Care | Payer: Medicare Other | Source: Ambulatory Visit | Attending: Internal Medicine | Admitting: Internal Medicine

## 2018-09-24 DIAGNOSIS — K769 Liver disease, unspecified: Secondary | ICD-10-CM

## 2018-09-24 DIAGNOSIS — K7689 Other specified diseases of liver: Secondary | ICD-10-CM | POA: Diagnosis not present

## 2018-11-21 DIAGNOSIS — Z23 Encounter for immunization: Secondary | ICD-10-CM | POA: Diagnosis not present

## 2019-08-20 DIAGNOSIS — R7301 Impaired fasting glucose: Secondary | ICD-10-CM | POA: Diagnosis not present

## 2019-08-20 DIAGNOSIS — I10 Essential (primary) hypertension: Secondary | ICD-10-CM | POA: Diagnosis not present

## 2019-08-20 DIAGNOSIS — Z0000 Encounter for general adult medical examination without abnormal findings: Secondary | ICD-10-CM | POA: Diagnosis not present

## 2019-08-20 DIAGNOSIS — E7849 Other hyperlipidemia: Secondary | ICD-10-CM | POA: Diagnosis not present

## 2019-08-30 DIAGNOSIS — E785 Hyperlipidemia, unspecified: Secondary | ICD-10-CM | POA: Diagnosis not present

## 2019-08-30 DIAGNOSIS — R82998 Other abnormal findings in urine: Secondary | ICD-10-CM | POA: Diagnosis not present

## 2019-08-30 DIAGNOSIS — Z0000 Encounter for general adult medical examination without abnormal findings: Secondary | ICD-10-CM | POA: Diagnosis not present

## 2019-08-30 DIAGNOSIS — R7301 Impaired fasting glucose: Secondary | ICD-10-CM | POA: Diagnosis not present

## 2019-08-30 DIAGNOSIS — I10 Essential (primary) hypertension: Secondary | ICD-10-CM | POA: Diagnosis not present

## 2019-08-31 DIAGNOSIS — Z1212 Encounter for screening for malignant neoplasm of rectum: Secondary | ICD-10-CM | POA: Diagnosis not present

## 2019-09-17 DIAGNOSIS — N390 Urinary tract infection, site not specified: Secondary | ICD-10-CM | POA: Diagnosis not present

## 2019-09-17 DIAGNOSIS — R8290 Unspecified abnormal findings in urine: Secondary | ICD-10-CM | POA: Diagnosis not present

## 2019-10-12 DIAGNOSIS — N401 Enlarged prostate with lower urinary tract symptoms: Secondary | ICD-10-CM | POA: Diagnosis not present

## 2019-10-12 DIAGNOSIS — R338 Other retention of urine: Secondary | ICD-10-CM | POA: Diagnosis not present

## 2019-10-12 DIAGNOSIS — R972 Elevated prostate specific antigen [PSA]: Secondary | ICD-10-CM | POA: Diagnosis not present

## 2019-10-12 DIAGNOSIS — R3912 Poor urinary stream: Secondary | ICD-10-CM | POA: Diagnosis not present

## 2019-10-19 DIAGNOSIS — R338 Other retention of urine: Secondary | ICD-10-CM | POA: Diagnosis not present

## 2019-10-19 DIAGNOSIS — N401 Enlarged prostate with lower urinary tract symptoms: Secondary | ICD-10-CM | POA: Diagnosis not present

## 2019-10-21 ENCOUNTER — Other Ambulatory Visit: Payer: Self-pay

## 2019-10-21 ENCOUNTER — Other Ambulatory Visit: Payer: Medicare Other

## 2019-10-21 DIAGNOSIS — Z20822 Contact with and (suspected) exposure to covid-19: Secondary | ICD-10-CM | POA: Diagnosis not present

## 2019-10-22 LAB — NOVEL CORONAVIRUS, NAA: SARS-CoV-2, NAA: NOT DETECTED

## 2019-10-22 LAB — SARS-COV-2, NAA 2 DAY TAT

## 2019-10-25 DIAGNOSIS — N401 Enlarged prostate with lower urinary tract symptoms: Secondary | ICD-10-CM | POA: Diagnosis not present

## 2019-10-25 DIAGNOSIS — R972 Elevated prostate specific antigen [PSA]: Secondary | ICD-10-CM | POA: Diagnosis not present

## 2019-10-25 DIAGNOSIS — N3000 Acute cystitis without hematuria: Secondary | ICD-10-CM | POA: Diagnosis not present

## 2019-10-25 DIAGNOSIS — R3912 Poor urinary stream: Secondary | ICD-10-CM | POA: Diagnosis not present

## 2019-11-03 DIAGNOSIS — H35371 Puckering of macula, right eye: Secondary | ICD-10-CM | POA: Diagnosis not present

## 2019-11-04 DIAGNOSIS — R338 Other retention of urine: Secondary | ICD-10-CM | POA: Diagnosis not present

## 2019-11-11 DIAGNOSIS — R3912 Poor urinary stream: Secondary | ICD-10-CM | POA: Diagnosis not present

## 2019-11-11 DIAGNOSIS — R339 Retention of urine, unspecified: Secondary | ICD-10-CM | POA: Diagnosis not present

## 2019-11-11 DIAGNOSIS — R338 Other retention of urine: Secondary | ICD-10-CM | POA: Diagnosis not present

## 2019-11-11 DIAGNOSIS — N401 Enlarged prostate with lower urinary tract symptoms: Secondary | ICD-10-CM | POA: Diagnosis not present

## 2019-11-16 DIAGNOSIS — R339 Retention of urine, unspecified: Secondary | ICD-10-CM | POA: Diagnosis not present

## 2019-12-01 DIAGNOSIS — H43813 Vitreous degeneration, bilateral: Secondary | ICD-10-CM | POA: Diagnosis not present

## 2019-12-01 DIAGNOSIS — H35373 Puckering of macula, bilateral: Secondary | ICD-10-CM | POA: Diagnosis not present

## 2019-12-02 DIAGNOSIS — H5203 Hypermetropia, bilateral: Secondary | ICD-10-CM | POA: Diagnosis not present

## 2019-12-10 DIAGNOSIS — R339 Retention of urine, unspecified: Secondary | ICD-10-CM | POA: Diagnosis not present

## 2019-12-23 DIAGNOSIS — N401 Enlarged prostate with lower urinary tract symptoms: Secondary | ICD-10-CM | POA: Diagnosis not present

## 2019-12-23 DIAGNOSIS — R338 Other retention of urine: Secondary | ICD-10-CM | POA: Diagnosis not present

## 2020-01-04 DIAGNOSIS — R339 Retention of urine, unspecified: Secondary | ICD-10-CM | POA: Diagnosis not present

## 2020-01-13 DIAGNOSIS — R3912 Poor urinary stream: Secondary | ICD-10-CM | POA: Diagnosis not present

## 2020-01-13 DIAGNOSIS — R338 Other retention of urine: Secondary | ICD-10-CM | POA: Diagnosis not present

## 2020-01-13 DIAGNOSIS — R972 Elevated prostate specific antigen [PSA]: Secondary | ICD-10-CM | POA: Diagnosis not present

## 2020-01-13 DIAGNOSIS — N401 Enlarged prostate with lower urinary tract symptoms: Secondary | ICD-10-CM | POA: Diagnosis not present

## 2020-01-31 DIAGNOSIS — R339 Retention of urine, unspecified: Secondary | ICD-10-CM | POA: Diagnosis not present

## 2020-02-09 HISTORY — PX: OTHER SURGICAL HISTORY: SHX169

## 2020-02-14 DIAGNOSIS — R3912 Poor urinary stream: Secondary | ICD-10-CM | POA: Diagnosis not present

## 2020-02-14 DIAGNOSIS — N401 Enlarged prostate with lower urinary tract symptoms: Secondary | ICD-10-CM | POA: Diagnosis not present

## 2020-02-29 DIAGNOSIS — N3000 Acute cystitis without hematuria: Secondary | ICD-10-CM | POA: Diagnosis not present

## 2020-03-01 DIAGNOSIS — N3000 Acute cystitis without hematuria: Secondary | ICD-10-CM | POA: Diagnosis not present

## 2020-03-14 ENCOUNTER — Encounter (HOSPITAL_COMMUNITY): Payer: Self-pay | Admitting: Urology

## 2020-03-14 ENCOUNTER — Observation Stay (HOSPITAL_COMMUNITY): Payer: Medicare Other

## 2020-03-14 ENCOUNTER — Inpatient Hospital Stay (HOSPITAL_COMMUNITY)
Admission: AD | Admit: 2020-03-14 | Discharge: 2020-03-16 | DRG: 713 | Disposition: A | Discharge status: Home / Self Care | Payer: Medicare Other | Source: Ambulatory Visit | Attending: Urology | Admitting: Urology

## 2020-03-14 DIAGNOSIS — Z79899 Other long term (current) drug therapy: Secondary | ICD-10-CM | POA: Diagnosis not present

## 2020-03-14 DIAGNOSIS — S01511A Laceration without foreign body of lip, initial encounter: Secondary | ICD-10-CM | POA: Diagnosis not present

## 2020-03-14 DIAGNOSIS — Z20822 Contact with and (suspected) exposure to covid-19: Secondary | ICD-10-CM | POA: Diagnosis not present

## 2020-03-14 DIAGNOSIS — N419 Inflammatory disease of prostate, unspecified: Secondary | ICD-10-CM | POA: Diagnosis not present

## 2020-03-14 DIAGNOSIS — N3289 Other specified disorders of bladder: Secondary | ICD-10-CM | POA: Diagnosis not present

## 2020-03-14 DIAGNOSIS — S0081XA Abrasion of other part of head, initial encounter: Secondary | ICD-10-CM | POA: Diagnosis not present

## 2020-03-14 DIAGNOSIS — I10 Essential (primary) hypertension: Secondary | ICD-10-CM | POA: Diagnosis not present

## 2020-03-14 DIAGNOSIS — R338 Other retention of urine: Secondary | ICD-10-CM | POA: Diagnosis not present

## 2020-03-14 DIAGNOSIS — R55 Syncope and collapse: Secondary | ICD-10-CM | POA: Insufficient documentation

## 2020-03-14 DIAGNOSIS — Z7982 Long term (current) use of aspirin: Secondary | ICD-10-CM | POA: Diagnosis not present

## 2020-03-14 DIAGNOSIS — I951 Orthostatic hypotension: Secondary | ICD-10-CM | POA: Diagnosis not present

## 2020-03-14 DIAGNOSIS — W01198A Fall on same level from slipping, tripping and stumbling with subsequent striking against other object, initial encounter: Secondary | ICD-10-CM | POA: Insufficient documentation

## 2020-03-14 DIAGNOSIS — N401 Enlarged prostate with lower urinary tract symptoms: Secondary | ICD-10-CM | POA: Diagnosis not present

## 2020-03-14 DIAGNOSIS — N3000 Acute cystitis without hematuria: Secondary | ICD-10-CM | POA: Diagnosis not present

## 2020-03-14 DIAGNOSIS — S0121XA Laceration without foreign body of nose, initial encounter: Secondary | ICD-10-CM | POA: Insufficient documentation

## 2020-03-14 DIAGNOSIS — S0990XA Unspecified injury of head, initial encounter: Secondary | ICD-10-CM | POA: Diagnosis not present

## 2020-03-14 DIAGNOSIS — R310 Gross hematuria: Secondary | ICD-10-CM | POA: Diagnosis present

## 2020-03-14 DIAGNOSIS — N320 Bladder-neck obstruction: Secondary | ICD-10-CM | POA: Diagnosis not present

## 2020-03-14 DIAGNOSIS — N421 Congestion and hemorrhage of prostate: Secondary | ICD-10-CM | POA: Diagnosis not present

## 2020-03-14 DIAGNOSIS — N400 Enlarged prostate without lower urinary tract symptoms: Secondary | ICD-10-CM | POA: Diagnosis not present

## 2020-03-14 DIAGNOSIS — N138 Other obstructive and reflux uropathy: Secondary | ICD-10-CM | POA: Diagnosis present

## 2020-03-14 DIAGNOSIS — J3489 Other specified disorders of nose and nasal sinuses: Secondary | ICD-10-CM | POA: Diagnosis not present

## 2020-03-14 LAB — CBC WITH DIFFERENTIAL/PLATELET
Abs Immature Granulocytes: 0.09 10*3/uL — ABNORMAL HIGH (ref 0.00–0.07)
Basophils Absolute: 0.1 10*3/uL (ref 0.0–0.1)
Basophils Relative: 0 %
Eosinophils Absolute: 0.1 10*3/uL (ref 0.0–0.5)
Eosinophils Relative: 0 %
HCT: 31.2 % — ABNORMAL LOW (ref 39.0–52.0)
Hemoglobin: 9.7 g/dL — ABNORMAL LOW (ref 13.0–17.0)
Immature Granulocytes: 1 %
Lymphocytes Relative: 5 %
Lymphs Abs: 0.9 10*3/uL (ref 0.7–4.0)
MCH: 19.8 pg — ABNORMAL LOW (ref 26.0–34.0)
MCHC: 31.1 g/dL (ref 30.0–36.0)
MCV: 63.5 fL — ABNORMAL LOW (ref 80.0–100.0)
Monocytes Absolute: 0.7 10*3/uL (ref 0.1–1.0)
Monocytes Relative: 4 %
Neutro Abs: 17 10*3/uL — ABNORMAL HIGH (ref 1.7–7.7)
Neutrophils Relative %: 90 %
Platelets: 300 10*3/uL (ref 150–400)
RBC: 4.91 MIL/uL (ref 4.22–5.81)
RDW: 15.6 % — ABNORMAL HIGH (ref 11.5–15.5)
WBC: 18.8 10*3/uL — ABNORMAL HIGH (ref 4.0–10.5)
nRBC: 0 % (ref 0.0–0.2)

## 2020-03-14 LAB — BASIC METABOLIC PANEL
Anion gap: 13 (ref 5–15)
BUN: 26 mg/dL — ABNORMAL HIGH (ref 8–23)
CO2: 21 mmol/L — ABNORMAL LOW (ref 22–32)
Calcium: 8.8 mg/dL — ABNORMAL LOW (ref 8.9–10.3)
Chloride: 99 mmol/L (ref 98–111)
Creatinine, Ser: 1.39 mg/dL — ABNORMAL HIGH (ref 0.61–1.24)
GFR, Estimated: 55 mL/min — ABNORMAL LOW (ref >60)
Glucose, Bld: 186 mg/dL — ABNORMAL HIGH (ref 70–99)
Potassium: 4.3 mmol/L (ref 3.5–5.1)
Sodium: 133 mmol/L — ABNORMAL LOW (ref 135–145)

## 2020-03-14 LAB — TYPE AND SCREEN
ABO/RH(D): O POS
Antibody Screen: NEGATIVE

## 2020-03-14 LAB — LACTIC ACID, PLASMA: Lactic Acid, Venous: 3.7 mmol/L (ref 0.5–1.9)

## 2020-03-14 MED ORDER — DOCUSATE SODIUM 100 MG PO CAPS
100.0000 mg | ORAL_CAPSULE | Freq: Two times a day (BID) | ORAL | Status: DC
  Administered 2020-03-14 – 2020-03-15 (×2): 100 mg via ORAL
  Filled 2020-03-14 (×2): qty 1

## 2020-03-14 MED ORDER — CEFAZOLIN SODIUM-DEXTROSE 2-4 GM/100ML-% IV SOLN
2.0000 g | Freq: Three times a day (TID) | INTRAVENOUS | Status: DC
  Administered 2020-03-14 – 2020-03-16 (×5): 2 g via INTRAVENOUS
  Filled 2020-03-14 (×7): qty 100

## 2020-03-14 MED ORDER — OXYCODONE HCL 5 MG PO TABS
5.0000 mg | ORAL_TABLET | ORAL | Status: DC | PRN
  Administered 2020-03-15: 5 mg via ORAL
  Filled 2020-03-14: qty 1

## 2020-03-14 MED ORDER — KCL IN DEXTROSE-NACL 20-5-0.45 MEQ/L-%-% IV SOLN
INTRAVENOUS | Status: DC
  Filled 2020-03-14 (×4): qty 1000

## 2020-03-14 MED ORDER — SODIUM CHLORIDE 0.9 % IV SOLN: 2.0000 g | Freq: Once | INTRAVENOUS | Status: DC

## 2020-03-14 MED ORDER — SODIUM CHLORIDE 0.9 % IR SOLN
3000.0000 mL | Status: DC
  Administered 2020-03-14 (×2): 6000 mL
  Administered 2020-03-15 (×6): 3000 mL

## 2020-03-14 MED ORDER — BELLADONNA ALKALOIDS-OPIUM 16.2-60 MG RE SUPP: 1.0000 | Freq: Four times a day (QID) | RECTAL | Status: DC | PRN

## 2020-03-14 MED ORDER — LACTATED RINGERS IV SOLN: INTRAVENOUS | Status: DC

## 2020-03-14 MED ORDER — ACETAMINOPHEN 325 MG PO TABS: 650.0000 mg | ORAL_TABLET | ORAL | Status: DC | PRN

## 2020-03-14 MED ORDER — LACTATED RINGERS IV BOLUS
500.0000 mL | Freq: Once | INTRAVENOUS | Status: AC
  Administered 2020-03-14: 500 mL via INTRAVENOUS

## 2020-03-14 MED ORDER — ONDANSETRON HCL 4 MG/2ML IJ SOLN: 4.0000 mg | INTRAMUSCULAR | Status: DC | PRN

## 2020-03-14 NOTE — ED Notes (Signed)
Standard foley drainage bag placed on pt

## 2020-03-14 NOTE — H&P (Addendum)
H&P  Chief Complaint: gross hematuria  History of Present Illness: 70 y.o. male with history of BPH with urinary retention s/p Rezum tx on 12/6 who is admitted for gross hematuria and clot retention from urology clinic.   Patient presented to clinic today with complaints of cloudy appearance of his urine and he dysuria. Symptoms began over the weekend. He had an old Rx for Cefdinir at home, which he started. He has used 6 doses thus far. He felt as though he was voiding adequately, but bladder scan in clinic notable for 600cc. Attempted CIC today in clinic, met some resistance and noted return of blood. Catheter was removed. Straight catheter was attempted, but also unsuccessful. A 14 Fr Coude was placed and urine output was clear though he continued to have some bloody urethral output. It was recommended to leaving indwelling catheter in place but patient preferred to trial resuming CIC at home. Patient returned to clinic later in the day with gross blood and inability to pass catheter. Hematuria catheter was placed and patient hand irrigated with return of copious clot. Decision was made to admit to the hospital for CBI.   He denies any suprapubic pain, perineal pain, or unilateral flank pain. No complaints of fevers, but he has had some intermittent chills. He also endorses some general malaise. No complaints of nausea or vomiting. Patient reports that he did not have blood in his urine prior to this point.  Past Medical History:  Diagnosis Date  . Allergy    mild   . Cataract    basal cell skin cancer face   . Hyperlipidemia   . Hypertension   . Near syncope    with extensive cardiac evaluation- nuc stress '10  . Neuromuscular disorder (HCC)    neuropathy - feet- non diabetic   . Sleep apnea    uvula removed for sleep apnea   . Thalassanemia   . Thalassemia trait    minor     Past Surgical History:  Procedure Laterality Date  . ACHILLES TENDON REPAIR    . BASAL CELL CARCINOMA  EXCISION  03/2013   face  . COLONOSCOPY  04/10/2006   [facility]   . UVULOPALATOPHARYNGOPLASTY, TONSILLECTOMY AND SEPTOPLASTY      Home Medications:  No current facility-administered medications on file prior to encounter.   Current Outpatient Medications on File Prior to Encounter  Medication Sig Dispense Refill  . Ascorbic Acid (VITAMIN C) 1000 MG tablet Take 1,000 mg by mouth daily.      Marland Kitchen aspirin 81 MG chewable tablet Chew 81 mg by mouth daily.    Marland Kitchen atovaquone-proguanil (MALARONE) 250-100 MG TABS tablet Take 1 tablet by mouth daily. Start 2 days prior to travel to malaria area, throughout travel and for 7 days upon return. 23 tablet 0  . B COMPLEX-C PO Take by mouth daily.    . Calcium-Vitamins C & D (CALCIUM/C/D PO) Take by mouth daily.    Marland Kitchen CINNAMON PO Take by mouth daily.    . ciprofloxacin (CIPRO) 500 MG tablet Take 1 tablet (500 mg total) by mouth 2 (two) times daily. Take 1-2 days until diarrhea resolves 8 tablet 0  . Coenzyme Q10 50 MG CAPS Take 1 capsule by mouth at bedtime.    . Evolocumab (REPATHA SURECLICK) 510 MG/ML SOAJ Inject into the skin every 21 ( twenty-one) days.    Marland Kitchen losartan (COZAAR) 50 MG tablet Take 50 mg by mouth daily.  11  . Magnesium Oxide (MAG-200 PO) Take  by mouth daily.    . MULTIPLE VITAMIN PO Take by mouth daily.      . Omega-3 Fatty Acids (FISH OIL) 1000 MG CAPS Take 3 capsules by mouth daily.    Marland Kitchen OVER THE COUNTER MEDICATION daily. tumeric    . POTASSIUM PO Take by mouth daily.    . vitamin E 100 UNIT capsule Take 100 Units by mouth daily.    Marland Kitchen zolpidem (AMBIEN) 10 MG tablet Take 1 tablet (10 mg total) by mouth at bedtime as needed. 90 tablet 0     Allergies:  Allergies  Allergen Reactions  . Sulfamethoxazole-Trimethoprim     Family History  Problem Relation Age of Onset  . Bipolar disorder Mother   . Mental illness Mother   . Hyperlipidemia Father   . Heart disease Father   . Colon cancer Maternal Uncle   . Cancer Neg Hx   . COPD Neg Hx    . Diabetes Neg Hx   . Colon polyps Neg Hx   . Esophageal cancer Neg Hx   . Rectal cancer Neg Hx   . Stomach cancer Neg Hx     Social History:  reports that he has never smoked. He has never used smokeless tobacco. He reports current alcohol use of about 5.0 standard drinks of alcohol per week. He reports that he does not use drugs.  ROS: A complete review of systems was performed.  All systems are negative except for pertinent findings as noted.  Physical Exam:  Vital signs in last 24 hours: BP: ()/()  Arterial Line BP: ()/()  Constitutional:  Alert and oriented, No acute distress Cardiovascular: Regular rate and rhythm, No JVD Respiratory: Normal respiratory effort, Lungs clear bilaterally GI: Abdomen is soft, nontender, nondistended, no abdominal masses Genitourinary: Foley in place, urine cherry red with scant clot on manual irrigation Neurologic: Grossly intact, no focal deficits Psychiatric: Normal mood and affect  Laboratory Data:  No results for input(s): WBC, HGB, HCT, PLT in the last 72 hours.  No results for input(s): NA, K, CL, GLUCOSE, BUN, CALCIUM, CREATININE in the last 72 hours.  Invalid input(s): CO3   No results found for this or any previous visit (from the past 24 hour(s)). No results found for this or any previous visit (from the past 240 hour(s)).  Renal Function: No results for input(s): CREATININE in the last 168 hours. CrCl cannot be calculated (Patient's most recent lab result is older than the maximum 21 days allowed.).  Radiologic Imaging: No results found.  Impression/Assessment:  70 y.o. male with history of BPH/urinary retention requiring CIC most recently s/p Rezum on 12/6, now with persistent post operative urinary retention and gross hematuria.  Plan:  - Admit to urology - Continue 3-way hematuria catheter, initiate CBI, titrate fluid to light pink. OK for RN to irrigate catheter PRN - Will start empiric abx based on prior culture  until repeat culture results - NPO at midnight for possible OR tomorrow for TURP/clot evacuation - AM labs  - SCDs for ppx  ADDENDUM -- patient with a fall from standing in the ED while waiting for admission. Discussed with ED provider, CT head negative and no other injuries. Labwork wnl.   Plan discussed with Dr. Tresa Moore.  Carmie Kanner 03/14/2020, 6:16 PM

## 2020-03-14 NOTE — ED Provider Notes (Signed)
[facility] DEPT Provider Note   CSN: [id number] Arrival date & time:        History Chief Complaint  Patient presents with  . Fall    Zachary Gallegos is a 70 y.o. male.  HPI Patient was sent to [facility] long for direct admission to urology service.  The patient went into the emergency department to get admitted.  He reports he had been in the department about 30 minutes.  He got up to talk to registration about a phone call he received and as he turned to walk back to his seat, he suddenly got lightheaded and passed out.  He fell and directly hit his face.  Patient denies he has any headache.  He reports he thinks he got lightheaded from his blood pressure drop.  He reports his blood pressure was very elevated this afternoon his urologist office and sometimes he will get a blood pressure drop and become lightheaded.  He reports after his evaluation at the urologist office including 2 visits today with bladder irrigation and coud catheter placement, he went home any ate dinner and tried to hydrate.  He has not been vomiting.  Reports he does have chills and was told he had a urinary tract infection was being admitted to get IV antibiotics and continuous bladder irrigation for hematuria with clots.  Patient reports he has some pain in his lip where he fell but denies neck pain, weakness numbness or tingling to the extremities.  Reports he does not have any abdominal pain or chest pain.  He has the catheter bag on his leg which has predominantly blood in it.  Reports he does feel like he is having some chills.  Patient denies being on blood thinners.  He has discontinued aspirin as well.    Past Medical History:  Diagnosis Date  . Allergy    mild   . Cataract    basal cell skin cancer face   . Hyperlipidemia   . Hypertension   . Near syncope    with extensive cardiac evaluation- nuc stress '10  . Neuromuscular disorder (HCC)    neuropathy - feet- non  diabetic   . Sleep apnea    uvula removed for sleep apnea   . Thalassanemia   . Thalassemia trait    minor     Patient Active Problem List   Diagnosis Date Noted  . Gross hematuria 03/14/2020  . 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 Travel advice encounter 11/15/2016  . PAD (peripheral artery disease) ([place]) 08/15/2011  . Routine health maintenance 12/05/2010  . Hyperlipidemia 04/07/2007  . Essential hypertension 04/07/2007    Past Surgical History:  Procedure Laterality Date  . ACHILLES TENDON REPAIR    . BASAL CELL CARCINOMA EXCISION  03/2013   face  . COLONOSCOPY  04/10/2006   [facility]   . UVULOPALATOPHARYNGOPLASTY, TONSILLECTOMY AND SEPTOPLASTY         Family History  Problem Relation Age of Onset  . Bipolar disorder Mother   . Mental illness Mother   . Hyperlipidemia Father   . Heart disease Father   . Colon cancer Maternal Uncle   . Cancer Neg Hx   . COPD Neg Hx   . Diabetes Neg Hx   . Colon polyps Neg Hx   . Esophageal cancer Neg Hx   . Rectal cancer Neg Hx   . Stomach cancer Neg Hx     Social History   Tobacco Use  .  Smoking status: Never Smoker  . Smokeless tobacco: Never Used  Substance Use Topics  . Alcohol use: Yes    Alcohol/week: 5.0 standard drinks    Types: 5 Standard drinks or equivalent per week  . Drug use: No    Home Medications Prior to Admission medications   Medication Sig Start Date End Date Taking? Authorizing Provider  Ascorbic Acid (VITAMIN C) 1000 MG tablet Take 1,000 mg by mouth daily.      [provider]  aspirin 81 MG chewable tablet Chew 81 mg by mouth daily.    [provider]  atovaquone-proguanil (MALARONE) 250-100 MG TABS tablet Take 1 tablet by mouth daily. Start 2 days prior to travel to malaria area, throughout travel and for 7 days upon return. 11/15/16   Thayer Headings, MD  B COMPLEX-C PO Take by mouth daily.    [provider]  Calcium-Vitamins C & D  (CALCIUM/C/D PO) Take by mouth daily.    [provider]  CINNAMON PO Take by mouth daily.    [provider]  ciprofloxacin (CIPRO) 500 MG tablet Take 1 tablet (500 mg total) by mouth 2 (two) times daily. Take 1-2 days until diarrhea resolves 11/15/16   Thayer Headings, MD  Coenzyme Q10 50 MG CAPS Take 1 capsule by mouth at bedtime.    [provider]  Evolocumab (REPATHA SURECLICK) XX123456 MG/ML SOAJ Inject into the skin every 21 ( twenty-one) days.    [provider]  losartan (COZAAR) 50 MG tablet Take 50 mg by mouth daily. 05/30/17   [provider]  Magnesium Oxide (MAG-200 PO) Take by mouth daily.    [provider]  MULTIPLE VITAMIN PO Take by mouth daily.      [provider]  Omega-3 Fatty Acids (FISH OIL) 1000 MG CAPS Take 3 capsules by mouth daily.    [provider]  OVER THE COUNTER MEDICATION daily. tumeric    [provider]  POTASSIUM PO Take by mouth daily.    [provider]  vitamin E 100 UNIT capsule Take 100 Units by mouth daily.    [provider]  zolpidem (AMBIEN) 10 MG tablet Take 1 tablet (10 mg total) by mouth at bedtime as needed. 04/28/15   Hoyt Koch, MD    Allergies    Sulfamethoxazole-trimethoprim  Review of Systems   Review of Systems 10 systems reviewed and negative except as per HPI Physical Exam Updated Vital Signs BP 108/62 (BP Location: Left Arm)   Pulse 95   Temp 97.6 F (36.4 C) (Oral)   Resp 15   SpO2 100%   Physical Exam Constitutional:      Comments: Patient is now supine in hospital stretcher.  He has a cervical collar in place that was placed by nursing staff.  GCS 15 patient is alert.  No respiratory distress.  HENT:     Head:     Comments: Patient has a 4 mm, superficial laceration to the nasal bridge.  No active bleeding.  Minor surrounding swelling.  No bleeding from the naris. Approximately 1 cm, superficial avulsion type  laceration to the left upper lip.  No gaping or suturable laceration.  No bleeding.  Teeth are stable.    Mouth/Throat:     Pharynx: Oropharynx is clear.  Eyes:     Extraocular Movements: Extraocular movements intact.     Pupils: Pupils are equal, round, and reactive to light.  Cardiovascular:     Rate  and Rhythm: Normal rate and regular rhythm.  Pulmonary:     Effort: Pulmonary effort is normal.     Breath sounds: Normal breath sounds.  Abdominal:     General: There is no distension.     Palpations: Abdomen is soft.     Tenderness: There is no abdominal tenderness. There is no guarding.  Genitourinary:    Comments: Foley bag has red blood it is full. Musculoskeletal:        General: No swelling, tenderness, deformity or signs of injury. Normal range of motion.     Right lower leg: No edema.     Left lower leg: No edema.  Skin:    General: Skin is warm and dry.     Coloration: Skin is pale.  Neurological:     General: No focal deficit present.     Mental Status: He is oriented to person, place, and time.     Cranial Nerves: No cranial nerve deficit.     Coordination: Coordination normal.  Psychiatric:        Mood and Affect: Mood normal.     ED Results / Procedures / Treatments   Labs (all labs ordered are listed, but only abnormal results are displayed) Labs Reviewed  CULTURE, BLOOD (ROUTINE X 2)  CULTURE, BLOOD (ROUTINE X 2)  CBC WITH DIFFERENTIAL/PLATELET  BASIC METABOLIC PANEL  LACTIC ACID, PLASMA  LACTIC ACID, PLASMA  TYPE AND SCREEN    EKG None  Radiology No results found.  Procedures Procedures (including critical care time)  Medications Ordered in ED Medications  lactated ringers bolus 500 mL (has no administration in time range)  lactated ringers infusion (has no administration in time range)  cefTRIAXone (ROCEPHIN) 2 g in sodium chloride 0.9 % 100 mL IVPB (has no administration in time range)    ED Course  I have reviewed the triage vital  signs and the nursing notes.  Pertinent labs & imaging results that were available during my care of the patient were reviewed by me and considered in my medical decision making (see chart for details).    MDM Rules/Calculators/A&P                          Patient was in the process of being admitted directly from urology.  He was in the triage area and had a syncopal episode.  This appears consistent with an orthostatic syncopal event. Patient evaluated and CT head obtained.  Screening labs obtained.  Patient stable for admission as planned Final Clinical Impression(s) / ED Diagnoses Final diagnoses:  Fall, initial encounter  Orthostatic syncope  Abrasion of face, initial encounter    Rx / DC Orders ED Discharge Orders    None       Arby Barrette, MD 03/18/20 [phone]

## 2020-03-15 ENCOUNTER — Inpatient Hospital Stay (HOSPITAL_COMMUNITY): Payer: Medicare Other | Admitting: Anesthesiology

## 2020-03-15 ENCOUNTER — Encounter (HOSPITAL_COMMUNITY)
Admission: AD | Disposition: A | Discharge status: Home / Self Care | Payer: Self-pay | Source: Ambulatory Visit | Attending: Urology

## 2020-03-15 ENCOUNTER — Other Ambulatory Visit: Payer: Self-pay

## 2020-03-15 ENCOUNTER — Encounter (HOSPITAL_COMMUNITY): Payer: Self-pay | Admitting: Urology

## 2020-03-15 DIAGNOSIS — N401 Enlarged prostate with lower urinary tract symptoms: Secondary | ICD-10-CM | POA: Diagnosis present

## 2020-03-15 DIAGNOSIS — S0121XA Laceration without foreign body of nose, initial encounter: Secondary | ICD-10-CM | POA: Diagnosis present

## 2020-03-15 DIAGNOSIS — Z79899 Other long term (current) drug therapy: Secondary | ICD-10-CM | POA: Diagnosis not present

## 2020-03-15 DIAGNOSIS — S01511A Laceration without foreign body of lip, initial encounter: Secondary | ICD-10-CM | POA: Diagnosis present

## 2020-03-15 DIAGNOSIS — W01198A Fall on same level from slipping, tripping and stumbling with subsequent striking against other object, initial encounter: Secondary | ICD-10-CM | POA: Diagnosis present

## 2020-03-15 DIAGNOSIS — N138 Other obstructive and reflux uropathy: Secondary | ICD-10-CM | POA: Diagnosis present

## 2020-03-15 DIAGNOSIS — Z7982 Long term (current) use of aspirin: Secondary | ICD-10-CM | POA: Diagnosis not present

## 2020-03-15 DIAGNOSIS — Z20822 Contact with and (suspected) exposure to covid-19: Secondary | ICD-10-CM | POA: Diagnosis present

## 2020-03-15 DIAGNOSIS — R310 Gross hematuria: Secondary | ICD-10-CM | POA: Diagnosis present

## 2020-03-15 DIAGNOSIS — I10 Essential (primary) hypertension: Secondary | ICD-10-CM | POA: Diagnosis present

## 2020-03-15 DIAGNOSIS — R55 Syncope and collapse: Secondary | ICD-10-CM | POA: Diagnosis present

## 2020-03-15 DIAGNOSIS — R338 Other retention of urine: Secondary | ICD-10-CM | POA: Diagnosis present

## 2020-03-15 HISTORY — PX: TRANSURETHRAL RESECTION OF PROSTATE: SHX73

## 2020-03-15 LAB — BASIC METABOLIC PANEL
Anion gap: 10 (ref 5–15)
BUN: 26 mg/dL — ABNORMAL HIGH (ref 8–23)
CO2: 24 mmol/L (ref 22–32)
Calcium: 8.5 mg/dL — ABNORMAL LOW (ref 8.9–10.3)
Chloride: 106 mmol/L (ref 98–111)
Creatinine, Ser: 1.15 mg/dL (ref 0.61–1.24)
GFR, Estimated: >60 mL/min (ref >60)
Glucose, Bld: 137 mg/dL — ABNORMAL HIGH (ref 70–99)
Potassium: 4.2 mmol/L (ref 3.5–5.1)
Sodium: 140 mmol/L (ref 135–145)

## 2020-03-15 LAB — SARS CORONAVIRUS 2 (TAT 6-24 HRS): SARS Coronavirus 2: NEGATIVE

## 2020-03-15 LAB — HEMOGLOBIN AND HEMATOCRIT, BLOOD
HCT: 25.6 % — ABNORMAL LOW (ref 39.0–52.0)
Hemoglobin: 7.9 g/dL — ABNORMAL LOW (ref 13.0–17.0)

## 2020-03-15 LAB — ABO/RH: ABO/RH(D): O POS

## 2020-03-15 LAB — GLUCOSE, CAPILLARY: Glucose-Capillary: 131 mg/dL — ABNORMAL HIGH (ref 70–99)

## 2020-03-15 SURGERY — TURP (TRANSURETHRAL RESECTION OF PROSTATE)
Anesthesia: General

## 2020-03-15 MED ORDER — FENTANYL CITRATE (PF) 100 MCG/2ML IJ SOLN
INTRAMUSCULAR | Status: AC
  Filled 2020-03-15: qty 2

## 2020-03-15 MED ORDER — ONDANSETRON HCL 4 MG/2ML IJ SOLN
INTRAMUSCULAR | Status: DC | PRN
  Administered 2020-03-15: 4 mg via INTRAVENOUS

## 2020-03-15 MED ORDER — FINASTERIDE 5 MG PO TABS: 5.0000 mg | ORAL_TABLET | Freq: Every day | ORAL | Status: DC

## 2020-03-15 MED ORDER — PROPOFOL 10 MG/ML IV BOLUS
INTRAVENOUS | Status: DC | PRN
  Administered 2020-03-15: 200 mg via INTRAVENOUS

## 2020-03-15 MED ORDER — EPHEDRINE SULFATE-NACL 50-0.9 MG/10ML-% IV SOSY
PREFILLED_SYRINGE | INTRAVENOUS | Status: DC | PRN
  Administered 2020-03-15 (×2): 10 mg via INTRAVENOUS

## 2020-03-15 MED ORDER — CHLORHEXIDINE GLUCONATE CLOTH 2 % EX PADS
6.0000 | MEDICATED_PAD | Freq: Every day | CUTANEOUS | Status: DC
  Administered 2020-03-15 – 2020-03-16 (×2): 6 via TOPICAL

## 2020-03-15 MED ORDER — ARTIFICIAL TEARS OPHTHALMIC OINT
TOPICAL_OINTMENT | OPHTHALMIC | Status: AC
  Filled 2020-03-15: qty 3.5

## 2020-03-15 MED ORDER — LIDOCAINE HCL (PF) 2 % IJ SOLN
INTRAMUSCULAR | Status: AC
  Filled 2020-03-15: qty 5

## 2020-03-15 MED ORDER — PHENYLEPHRINE 40 MCG/ML (10ML) SYRINGE FOR IV PUSH (FOR BLOOD PRESSURE SUPPORT)
PREFILLED_SYRINGE | INTRAVENOUS | Status: DC | PRN
  Administered 2020-03-15: 80 ug via INTRAVENOUS

## 2020-03-15 MED ORDER — 0.9 % SODIUM CHLORIDE (POUR BTL) OPTIME
TOPICAL | Status: DC | PRN
  Administered 2020-03-15: 1000 mL

## 2020-03-15 MED ORDER — EPHEDRINE 5 MG/ML INJ
INTRAVENOUS | Status: AC
  Filled 2020-03-15: qty 10

## 2020-03-15 MED ORDER — FENTANYL CITRATE (PF) 100 MCG/2ML IJ SOLN
INTRAMUSCULAR | Status: AC
  Administered 2020-03-15: 50 ug via INTRAVENOUS
  Filled 2020-03-15: qty 2

## 2020-03-15 MED ORDER — PHENYLEPHRINE 40 MCG/ML (10ML) SYRINGE FOR IV PUSH (FOR BLOOD PRESSURE SUPPORT)
PREFILLED_SYRINGE | INTRAVENOUS | Status: AC
  Filled 2020-03-15: qty 10

## 2020-03-15 MED ORDER — LIP MEDEX EX OINT
TOPICAL_OINTMENT | CUTANEOUS | Status: DC | PRN
  Filled 2020-03-15: qty 7

## 2020-03-15 MED ORDER — FENTANYL CITRATE (PF) 100 MCG/2ML IJ SOLN: 25.0000 ug | INTRAMUSCULAR | Status: DC | PRN

## 2020-03-15 MED ORDER — PROPOFOL 10 MG/ML IV BOLUS
INTRAVENOUS | Status: AC
  Filled 2020-03-15: qty 20

## 2020-03-15 MED ORDER — LACTATED RINGERS IV SOLN: INTRAVENOUS | Status: DC

## 2020-03-15 MED ORDER — SODIUM CHLORIDE 0.9 % IR SOLN
Status: DC | PRN
  Administered 2020-03-15: 21000 mL

## 2020-03-15 MED ORDER — STERILE WATER FOR IRRIGATION IR SOLN
Status: DC | PRN
  Administered 2020-03-15: 30 mL

## 2020-03-15 MED ORDER — LIDOCAINE 2% (20 MG/ML) 5 ML SYRINGE
INTRAMUSCULAR | Status: DC | PRN
  Administered 2020-03-15: 100 mg via INTRAVENOUS

## 2020-03-15 MED ORDER — LACTATED RINGERS IV SOLN: INTRAVENOUS | Status: DC | PRN

## 2020-03-15 MED ORDER — FENTANYL CITRATE (PF) 100 MCG/2ML IJ SOLN
INTRAMUSCULAR | Status: DC | PRN
  Administered 2020-03-15: 25 ug via INTRAVENOUS
  Administered 2020-03-15: 50 ug via INTRAVENOUS
  Administered 2020-03-15: 25 ug via INTRAVENOUS
  Administered 2020-03-15 (×2): 50 ug via INTRAVENOUS

## 2020-03-15 SURGICAL SUPPLY — 17 items
BAG DRN RND TRDRP ANRFLXCHMBR (UROLOGICAL SUPPLIES)
BAG URINE DRAIN 2000ML AR STRL (UROLOGICAL SUPPLIES) IMPLANT
BAG URO CATCHER STRL LF (MISCELLANEOUS) ×2 IMPLANT
CATH FOLEY 3WAY 30CC 22FR (CATHETERS) IMPLANT
CATH HEMA 3WAY 30CC 22FR COUDE (CATHETERS) ×1 IMPLANT
GLOVE SURG ENC MOIS LTX SZ6.5 (GLOVE) ×2 IMPLANT
HOLDER FOLEY CATH W/STRAP (MISCELLANEOUS) ×2 IMPLANT
KIT TURNOVER KIT A (KITS) ×2 IMPLANT
LOOP CUT BIPOLAR 24F LRG (ELECTROSURGICAL) ×2 IMPLANT
MANIFOLD NEPTUNE II (INSTRUMENTS) ×2 IMPLANT
PACK CYSTO (CUSTOM PROCEDURE TRAY) ×2 IMPLANT
PLUG CATH AND CAP STER (CATHETERS) ×2 IMPLANT
SYR 30ML LL (SYRINGE) IMPLANT
SYR TOOMEY IRRIG 70ML (MISCELLANEOUS) ×2
SYRINGE TOOMEY IRRIG 70ML (MISCELLANEOUS) ×1 IMPLANT
TUBING CONNECTING 10 (TUBING) ×2 IMPLANT
TUBING UROLOGY SET (TUBING) ×2 IMPLANT

## 2020-03-15 NOTE — Anesthesia Postprocedure Evaluation (Signed)
Anesthesia Post Note  Patient: Zachary Gallegos  Procedure(s) Performed: TRANSURETHRAL RESECTION OF THE PROSTATE (TURP) CLOT EVACUATION (N/A )     Patient location during evaluation: PACU Anesthesia Type: General Level of consciousness: awake and alert Pain management: pain level controlled Vital Signs Assessment: post-procedure vital signs reviewed and stable Respiratory status: spontaneous breathing, nonlabored ventilation, respiratory function stable and patient connected to nasal cannula oxygen Cardiovascular status: blood pressure returned to baseline and stable Postop Assessment: no apparent nausea or vomiting Anesthetic complications: no   No complications documented.  Last Vitals:  Vitals:   03/15/20 1815 03/15/20 1833  BP: (!) 153/94 (!) 147/81  Pulse: 89 89  Resp: 15 14  Temp:  36.9 C  SpO2: 97% 97%    Last Pain:  Vitals:   03/15/20 1833  TempSrc: Oral  PainSc:                  Kyleena Scheirer,W. EDMOND

## 2020-03-15 NOTE — Anesthesia Preprocedure Evaluation (Addendum)
Anesthesia Evaluation  Patient identified by MRN, date of birth, ID band Patient awake    Reviewed: Allergy & Precautions, H&P , NPO status , Patient's Chart, lab work & pertinent test results  History of Anesthesia Complications Negative for: history of anesthetic complications  Airway Mallampati: II  TM Distance: >3 FB Neck ROM: Full    Dental no notable dental hx. (+) Teeth Intact, Dental Advisory Given   Pulmonary sleep apnea (s/p UPPP) ,    Pulmonary exam normal breath sounds clear to auscultation       Cardiovascular hypertension, Pt. on medications + Peripheral Vascular Disease   Rhythm:Regular Rate:Normal  Nuclear stress 07/2017: EF 46%, low risk study    Neuro/Psych negative neurological ROS  negative psych ROS   GI/Hepatic negative GI ROS, Neg liver ROS,   Endo/Other  negative endocrine ROS  Renal/GU negative Renal ROS   Hematuria negative genitourinary   Musculoskeletal negative musculoskeletal ROS (+)   Abdominal   Peds  Hematology  (+) Blood dyscrasia, anemia , Hgb 7.9   Anesthesia Other Findings Day of surgery medications reviewed with patient.  Reproductive/Obstetrics negative OB ROS                           Anesthesia Physical Anesthesia Plan  ASA: II and emergent  Anesthesia Plan: General   Post-op Pain Management:    Induction: Intravenous  PONV Risk Score and Plan: 3 and Treatment may vary due to age or medical condition, Ondansetron and Dexamethasone  Airway Management Planned: LMA  Additional Equipment: None  Intra-op Plan:   Post-operative Plan: Extubation in OR  Informed Consent: I have reviewed the patients History and Physical, chart, labs and discussed the procedure including the risks, benefits and alternatives for the proposed anesthesia with the patient or authorized representative who has indicated his/her understanding and acceptance.      Dental advisory given  Plan Discussed with: CRNA and Surgeon  Anesthesia Plan Comments:       Anesthesia Quick Evaluation

## 2020-03-15 NOTE — H&P (View-Only) (Signed)
Urology Inpatient Progress Report  Gross hematuria [R31.0]        Intv/Subj: Patient admitted last night for continuous bladder irrigation. Required hand irrigation for clots overnight.   Active Problems:   Gross hematuria  Current Facility-Administered Medications  Medication Dose Route Frequency Provider Last Rate Last Admin  . acetaminophen (TYLENOL) tablet 650 mg  650 mg Oral Q4H PRN Nikas, Chrstine, MD      . ceFAZolin (ANCEF) IVPB 2g/100 mL premix  2 g Intravenous Q8H Nikas, Chrstine, MD 200 mL/hr at 03/15/20 0611 2 g at 03/15/20 0611  . dextrose 5 % and 0.45 % NaCl with KCl 20 mEq/L infusion   Intravenous Continuous Everlena Cooper, Chrstine, MD 100 mL/hr at 03/14/20 2339 New Bag at 03/14/20 2339  . docusate sodium (COLACE) capsule 100 mg  100 mg Oral BID Everlena Cooper, Chrstine, MD   100 mg at 03/14/20 2335  . finasteride (PROSCAR) tablet 5 mg  5 mg Oral Daily Alexis Frock, MD      . ondansetron [facility] injection 4 mg  4 mg Intravenous Q4H PRN Nikas, Chrstine, MD      . opium-belladonna (B&O) suppository 16.2-60mg   1 suppository Rectal Q6H PRN Everlena Cooper, Chrstine, MD      . oxyCODONE (Oxy IR/ROXICODONE) immediate release tablet 5 mg  5 mg Oral Q4H PRN Nikas, Chrstine, MD      . sodium chloride irrigation 0.9 % 3,000 mL  3,000 mL Irrigation Continuous Nikas, Chrstine, MD   3,000 mL at 03/15/20 0729     Objective: Vital: Vitals:   03/14/20 2206 03/15/20 0225 03/15/20 0402 03/15/20 0602  BP: 113/72 90/61  96/60  Pulse: 89 75  70  Resp: 18 18  18   Temp: 98.4 F (36.9 C) 98.5 F (36.9 C)  99.2 F (37.3 C)  TempSrc: Oral Oral  Oral  SpO2: 99% 97%  98%  Weight:   88.7 kg   Height: 6\' 1"  (1.854 m)  6\' 1"  (1.854 m)    I/Os: I/O last 3 completed shifts: In: 571.2 [I.V.:471.2; IV Piggyback:100] Out: 20800 [Urine:20800]  Physical Exam:  General: Patient is in no apparent distress Lungs: Normal respiratory effort, chest expands symmetrically. GI:The abdomen is soft and nontender Foley:  22Fr 3-way draining pink tinged on brisk drip with small clots seen Ext: lower extremities symmetric  Lab Results: Recent Labs    03/14/20 2004 03/15/20 0553  WBC 18.8*  --   HGB 9.7* 7.9*  HCT 31.2* 25.6*   Recent Labs    03/14/20 2005 03/15/20 0553  NA 133* 140  K 4.3 4.2  CL 99 106  CO2 21* 24  GLUCOSE 186* 137*  BUN 26* 26*  CREATININE 1.39* 1.15  CALCIUM 8.8* 8.5*   No results for input(s): LABPT, INR in the last 72 hours. No results for input(s): LABURIN in the last 72 hours. Results for orders placed or performed during the hospital encounter of 03/14/20  SARS CORONAVIRUS 2 (TAT 6-24 HRS) Nasopharyngeal Nasopharyngeal Swab     Status: None   Collection Time: 03/14/20  8:05 PM   Specimen: Nasopharyngeal Swab  Result Value Ref Range Status   SARS Coronavirus 2 NEGATIVE NEGATIVE Final    Comment: (NOTE) SARS-CoV-2 target nucleic acids are NOT DETECTED.  The SARS-CoV-2 RNA is generally detectable in upper and lower respiratory specimens during the acute phase of infection. Negative results do not preclude SARS-CoV-2 infection, do not rule out co-infections with other pathogens, and should not be used as the sole basis for treatment  or other patient management decisions. Negative results must be combined with clinical observations, patient history, and epidemiological information. The expected result is Negative.  Fact Sheet for Patients: HairSlick.no  Fact Sheet for Healthcare Providers: quierodirigir.com  This test is not yet approved or cleared by the [place] FDA and  has been authorized for detection and/or diagnosis of SARS-CoV-2 by FDA under an Emergency Use Authorization (EUA). This EUA will remain  in effect (meaning this test can be used) for the duration of the COVID-19 declaration under Se ction 564(b)(1) of the Act, 21 U.S.C. section 360bbb-3(b)(1), unless the authorization is terminated  or revoked sooner.  Performed at [facility], [address], [place], [place] [address]     Studies/Results: CT Head Wo Contrast  Result Date: 03/14/2020 CLINICAL DATA:  Syncope, head injury EXAM: CT HEAD WITHOUT CONTRAST TECHNIQUE: Contiguous axial images were obtained from the base of the skull through the vertex without intravenous contrast. COMPARISON:  None. FINDINGS: Brain: There is no acute intracranial hemorrhage, mass effect, or edema. Gray-white differentiation is preserved. There is no extra-axial fluid collection. Ventricles and sulci are within normal limits in size and configuration. Vascular: There is atherosclerotic calcification at the skull base. Skull: Calvarium is unremarkable. Sinuses/Orbits: Minor mucosal thickening.  Orbits are unremarkable. Other: None. IMPRESSION: No evidence of acute intracranial injury. Electronically Signed   By: Guadlupe Spanish M.D.   On: 03/14/2020 21:34    Assessment: 70 yo man with hx of acute urinary retention secondary to BPH who underwent Rezum 12/6 admitted for gross hematuria and clot urinary retention.  Currently on CBI moderate drip and pink tinged in tubing with clot.  Patient also with drop in Hgb.    Plan: -continue NPO status -plan to OR this afternoon for cysto, possible clot evacuation, possible mini-TURP -discussed with patient risks and benefits of procedure including but not limited to bleeding, pain, dysuria, irritative voiding symptoms, need for foley catheter, infection, retrograde ejaculation -discussed in detail the very small risk of ED and the plan to not resect down to capsule     Kasandra Knudsen, MD Urology 03/15/2020, 7:51 AM

## 2020-03-15 NOTE — Progress Notes (Addendum)
Patient has returned from surgery. No change from prior assessment, urine is peach to light yellow in color. No c/o of pain. Patient AO: 4  \Zachary Gallegos

## 2020-03-15 NOTE — Op Note (Signed)
PATIENT:  Zachary Gallegos  PRE-OPERATIVE DIAGNOSIS: Gross hematuria with clot urinary retention, BPH with bladder outlet obstruction  POST-OPERATIVE DIAGNOSIS: Same  PROCEDURE: Cystoscopy, clot evacuation, TURP  SURGEON:  Kasandra Knudsen, MD  INDICATION: Zachary Gallegos is a 70 year old man who initially presented with acute urinary retention found to have BPH with bladder outlet obstruction.  He underwent Rezum 12/6 and successfully passed voiding trial.  He then had elevated postvoid residuals and had difficulty self catheterizing after which he developed gross hematuria with significant clot burden.  He was seen in the urology office and catheter was irrigated.  He was subsequent admitted to the hospital for continuous bladder irrigation.  ANESTHESIA:  General  EBL:  Minimal  DRAINS:   SPECIMEN:  Prostate chips to pathology  After informed consent the patient was brought to the major OR and placed on the table. He was administered general anesthesia and then moved to the dorsal lithotomy position. His genitalia was sterilely prepped and draped and an official timeout was then performed.  Initially the 28 French resectoscope sheath with the visual obturator was passed into the bladder and the obturator removed. I then inserted the resectoscope element with 30 lens and  performed a systematic inspection of the bladder. I noted the bladder had mild trabeculation.  The ureteral orifices were noted to be of normal configuration and position. Withdrawing the scope into the prostatic urethra, there was a defect consistent with prior Rezum procedure.  There is also significant blood clot adherent to the prostatic urethra and seen in the bladder.  Clot evacuation then took place with a Toomey syringe.  No active bleeding was seen however the edges of the prostatic urethra were shaggy consistent with above-mentioned procedure.  A mini TURP was then performed to help achieve hemostasis.  The  irrigant was then turned off and hemostasis was adequate.  All prostate chips were removed with a Toomey syringe.  Reinspection of the bladder revealed no active bleeding.  Care was taken not to resect past the verumontanum.  Patient was noted to have a median lobe that was partially treated with Rezum.     The resectoscope was therefore removed and the 22 French three-way Foley catheter was then inserted and balloon was filled to 30 cc. This was placed on mild traction and the bladder was irrigated with the irrigant returning clear. The catheter was then hooked to closed system drainage and continuous irrigation and the patient was awakened and taken to recovery room in stable and satisfactory condition. He tolerated the procedure well and there were no intraoperative complications.  PLAN OF CARE: Observation overnight with anticipated discharge in the morning.  PATIENT DISPOSITION:  PACU - Hemodynamically stable.

## 2020-03-15 NOTE — Progress Notes (Signed)
CBI cont at this time, tolerating well. irrigated multiple time, large amount of clot removed but less clot at this time. Pt denies pain and pain med. Will cont to monitor.

## 2020-03-15 NOTE — Anesthesia Procedure Notes (Signed)
Procedure Name: LMA Insertion Date/Time: 03/15/2020 3:58 PM Performed by: Epimenio Sarin, CRNA Pre-anesthesia Checklist: Patient identified, Emergency Drugs available, Suction available, Patient being monitored and Timeout performed Patient Re-evaluated:Patient Re-evaluated prior to induction Oxygen Delivery Method: Circle system utilized Preoxygenation: Pre-oxygenation with 100% oxygen Induction Type: IV induction Ventilation: Mask ventilation without difficulty LMA: LMA inserted LMA Size: 5.0 Number of attempts: 1 Dental Injury: Teeth and Oropharynx as per pre-operative assessment

## 2020-03-15 NOTE — Progress Notes (Signed)
Urology Inpatient Progress Report  Gross hematuria [R31.0]        Intv/Subj: Patient admitted last night for continuous bladder irrigation. Required hand irrigation for clots overnight.   Active Problems:   Gross hematuria  Current Facility-Administered Medications  Medication Dose Route Frequency Provider Last Rate Last Admin  . acetaminophen (TYLENOL) tablet 650 mg  650 mg Oral Q4H PRN Nikas, Chrstine, MD      . ceFAZolin (ANCEF) IVPB 2g/100 mL premix  2 g Intravenous Q8H Nikas, Chrstine, MD 200 mL/hr at 03/15/20 0611 2 g at 03/15/20 0611  . dextrose 5 % and 0.45 % NaCl with KCl 20 mEq/L infusion   Intravenous Continuous Everlena Cooper, Chrstine, MD 100 mL/hr at 03/14/20 2339 New Bag at 03/14/20 2339  . docusate sodium (COLACE) capsule 100 mg  100 mg Oral BID Everlena Cooper, Chrstine, MD   100 mg at 03/14/20 2335  . finasteride (PROSCAR) tablet 5 mg  5 mg Oral Daily Alexis Frock, MD      . ondansetron [facility]) injection 4 mg  4 mg Intravenous Q4H PRN Nikas, Chrstine, MD      . opium-belladonna (B&O) suppository 16.2-60mg   1 suppository Rectal Q6H PRN Everlena Cooper, Chrstine, MD      . oxyCODONE (Oxy IR/ROXICODONE) immediate release tablet 5 mg  5 mg Oral Q4H PRN Nikas, Chrstine, MD      . sodium chloride irrigation 0.9 % 3,000 mL  3,000 mL Irrigation Continuous Nikas, Chrstine, MD   3,000 mL at 03/15/20 0729     Objective: Vital: Vitals:   03/14/20 2206 03/15/20 0225 03/15/20 0402 03/15/20 0602  BP: 113/72 90/61  96/60  Pulse: 89 75  70  Resp: 18 18  18   Temp: 98.4 F (36.9 C) 98.5 F (36.9 C)  99.2 F (37.3 C)  TempSrc: Oral Oral  Oral  SpO2: 99% 97%  98%  Weight:   88.7 kg   Height: 6\' 1"  (1.854 m)  6\' 1"  (1.854 m)    I/Os: I/O last 3 completed shifts: In: 571.2 [I.V.:471.2; IV Piggyback:100] Out: 20800 [Urine:20800]  Physical Exam:  General: Patient is in no apparent distress Lungs: Normal respiratory effort, chest expands symmetrically. GI:The abdomen is soft and nontender Foley:  22Fr 3-way draining pink tinged on brisk drip with small clots seen Ext: lower extremities symmetric  Lab Results: Recent Labs    03/14/20 2004 03/15/20 0553  WBC 18.8*  --   HGB 9.7* 7.9*  HCT 31.2* 25.6*   Recent Labs    03/14/20 2005 03/15/20 0553  NA 133* 140  K 4.3 4.2  CL 99 106  CO2 21* 24  GLUCOSE 186* 137*  BUN 26* 26*  CREATININE 1.39* 1.15  CALCIUM 8.8* 8.5*   No results for input(s): LABPT, INR in the last 72 hours. No results for input(s): LABURIN in the last 72 hours. Results for orders placed or performed during the hospital encounter of 03/14/20  SARS CORONAVIRUS 2 (TAT 6-24 HRS) Nasopharyngeal Nasopharyngeal Swab     Status: None   Collection Time: 03/14/20  8:05 PM   Specimen: Nasopharyngeal Swab  Result Value Ref Range Status   SARS Coronavirus 2 NEGATIVE NEGATIVE Final    Comment: (NOTE) SARS-CoV-2 target nucleic acids are NOT DETECTED.  The SARS-CoV-2 RNA is generally detectable in upper and lower respiratory specimens during the acute phase of infection. Negative results do not preclude SARS-CoV-2 infection, do not rule out co-infections with other pathogens, and should not be used as the sole basis for treatment  or other patient management decisions. Negative results must be combined with clinical observations, patient history, and epidemiological information. The expected result is Negative.  Fact Sheet for Patients: HairSlick.no  Fact Sheet for Healthcare Providers: quierodirigir.com  This test is not yet approved or cleared by the [place] FDA and  has been authorized for detection and/or diagnosis of SARS-CoV-2 by FDA under an Emergency Use Authorization (EUA). This EUA will remain  in effect (meaning this test can be used) for the duration of the COVID-19 declaration under Se ction 564(b)(1) of the Act, 21 U.S.C. section 360bbb-3(b)(1), unless the authorization is terminated  or revoked sooner.  Performed at [facility], [address], [place], [place] [address]     Studies/Results: CT Head Wo Contrast  Result Date: 03/14/2020 CLINICAL DATA:  Syncope, head injury EXAM: CT HEAD WITHOUT CONTRAST TECHNIQUE: Contiguous axial images were obtained from the base of the skull through the vertex without intravenous contrast. COMPARISON:  None. FINDINGS: Brain: There is no acute intracranial hemorrhage, mass effect, or edema. Gray-white differentiation is preserved. There is no extra-axial fluid collection. Ventricles and sulci are within normal limits in size and configuration. Vascular: There is atherosclerotic calcification at the skull base. Skull: Calvarium is unremarkable. Sinuses/Orbits: Minor mucosal thickening.  Orbits are unremarkable. Other: None. IMPRESSION: No evidence of acute intracranial injury. Electronically Signed   By: Guadlupe Spanish M.D.   On: 03/14/2020 21:34    Assessment: 70 yo man with hx of acute urinary retention secondary to BPH who underwent Rezum 12/6 admitted for gross hematuria and clot urinary retention.  Currently on CBI moderate drip and pink tinged in tubing with clot.  Patient also with drop in Hgb.    Plan: -continue NPO status -plan to OR this afternoon for cysto, possible clot evacuation, possible mini-TURP -discussed with patient risks and benefits of procedure including but not limited to bleeding, pain, dysuria, irritative voiding symptoms, need for foley catheter, infection, retrograde ejaculation -discussed in detail the very small risk of ED and the plan to not resect down to capsule     Kasandra Knudsen, MD Urology 03/15/2020, 7:51 AM

## 2020-03-15 NOTE — Interval H&P Note (Signed)
History and Physical Interval Note:  03/15/2020 3:25 PM  Tilden Dome  has presented today for surgery, with the diagnosis of BLEEDING.  The various methods of treatment have been discussed with the patient and family. After consideration of risks, benefits and other options for treatment, the patient has consented to  Procedure(s): TRANSURETHRAL RESECTION OF THE PROSTATE (TURP) CLOT EVACUATION (N/A) as a surgical intervention.  The patient's history has been reviewed, patient examined, no change in status, stable for surgery.  I have reviewed the patient's chart and labs.  Questions were answered to the patient's satisfaction.     Ann-Marie Kluge D [place] Feldpausch

## 2020-03-15 NOTE — Transfer of Care (Signed)
Immediate Anesthesia Transfer of Care Note  Patient: Zachary Gallegos  Procedure(s) Performed: TRANSURETHRAL RESECTION OF THE PROSTATE (TURP) CLOT EVACUATION (N/A )  Patient Location: PACU  Anesthesia Type:General  Level of Consciousness: drowsy and patient cooperative  Airway & Oxygen Therapy: Patient Spontanous Breathing and Patient connected to face mask oxygen  Post-op Assessment: Report given to RN and Post -op Vital signs reviewed and stable  Post vital signs: Reviewed and stable  Last Vitals:  Vitals Value Taken Time  BP 128/78 03/15/20 1706  Temp    Pulse 88 03/15/20 1709  Resp 12 03/15/20 1709  SpO2 99 % 03/15/20 1709  Vitals shown include unvalidated device data.  Last Pain:  Vitals:   03/15/20 1445  TempSrc:   PainSc: 0-No pain      Patients Stated Pain Goal: 0 (03/15/20 0745)  Complications: No complications documented.

## 2020-03-16 ENCOUNTER — Encounter (HOSPITAL_COMMUNITY): Payer: Self-pay | Admitting: Urology

## 2020-03-16 LAB — CBC
HCT: 25 % — ABNORMAL LOW (ref 39.0–52.0)
Hemoglobin: 7.7 g/dL — ABNORMAL LOW (ref 13.0–17.0)
MCH: 20 pg — ABNORMAL LOW (ref 26.0–34.0)
MCHC: 30.8 g/dL (ref 30.0–36.0)
MCV: 64.9 fL — ABNORMAL LOW (ref 80.0–100.0)
Platelets: 249 10*3/uL (ref 150–400)
RBC: 3.85 MIL/uL — ABNORMAL LOW (ref 4.22–5.81)
RDW: 15.7 % — ABNORMAL HIGH (ref 11.5–15.5)
WBC: 13.7 10*3/uL — ABNORMAL HIGH (ref 4.0–10.5)
nRBC: 0 % (ref 0.0–0.2)

## 2020-03-16 MED ORDER — TAMSULOSIN HCL 0.4 MG PO CAPS: 0.4000 mg | ORAL_CAPSULE | Freq: Every day | ORAL | 0 refills | Status: DC

## 2020-03-16 NOTE — Discharge Summary (Signed)
Date of admission: 03/14/2020  Date of discharge: 03/16/2020  Admission diagnosis: clot urinary retention, BPH with BOO  Discharge diagnosis: same  Secondary diagnoses:  Patient Active Problem List   Diagnosis Date Noted  . Gross hematuria 03/14/2020  . 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 Travel advice encounter 11/15/2016  . PAD (peripheral artery disease) ([place]) 08/15/2011  . Routine health maintenance 12/05/2010  . Hyperlipidemia 04/07/2007  . Essential hypertension 04/07/2007    Procedures performed: Procedure(s): TRANSURETHRAL RESECTION OF THE PROSTATE (TURP) CLOT EVACUATION  History and Physical: For full details, please see admission history and physical. Briefly, Zachary Gallegos is a 70 y.o. year old patient with who presented with gross hematuria and clot retention following Rezum on 12/6.  He was admitted and placed on continuous bladder irrigation. He underwent clot evacuation and mini-TURP on 1/5.    Hospital Course: Patient tolerated the procedure well.  He was then transferred to the floor after an uneventful PACU stay.  His hospital course was uncomplicated.  On POD#1 he had met discharge criteria: was eating a regular diet, was up and ambulating independently,  pain was well controlled, CBI was turned off and urine was clear, and was ready to for discharge.   Laboratory values:  Recent Labs    03/14/20 2004 03/15/20 0553 03/16/20 0529  WBC 18.8*  --  13.7*  HGB 9.7* 7.9* 7.7*  HCT 31.2* 25.6* 25.0*   Recent Labs    03/14/20 2005 03/15/20 0553  NA 133* 140  K 4.3 4.2  CL 99 106  CO2 21* 24  GLUCOSE 186* 137*  BUN 26* 26*  CREATININE 1.39* 1.15  CALCIUM 8.8* 8.5*   No results for input(s): LABPT, INR in the last 72 hours. No results for input(s): LABURIN in the last 72 hours. Results for orders placed or performed during the hospital encounter of 03/14/20  SARS CORONAVIRUS 2 (TAT 6-24 HRS) Nasopharyngeal  Nasopharyngeal Swab     Status: None   Collection Time: 03/14/20  8:05 PM   Specimen: Nasopharyngeal Swab  Result Value Ref Range Status   SARS Coronavirus 2 NEGATIVE NEGATIVE Final    Comment: (NOTE) SARS-CoV-2 target nucleic acids are NOT DETECTED.  The SARS-CoV-2 RNA is generally detectable in upper and lower respiratory specimens during the acute phase of infection. Negative results do not preclude SARS-CoV-2 infection, do not rule out co-infections with other pathogens, and should not be used as the sole basis for treatment or other patient management decisions. Negative results must be combined with clinical observations, patient history, and epidemiological information. The expected result is Negative.  Fact Sheet for Patients: SugarRoll.be  Fact Sheet for Healthcare Providers: https://www.woods-mathews.com/  This test is not yet approved or cleared by the [place] FDA and  has been authorized for detection and/or diagnosis of SARS-CoV-2 by FDA under an Emergency Use Authorization (EUA). This EUA will remain  in effect (meaning this test can be used) for the duration of the COVID-19 declaration under Se ction 564(b)(1) of the Act, 21 U.S.C. section 360bbb-3(b)(1), unless the authorization is terminated or revoked sooner.  Performed at [facility], [place] [address], [place], [place] [address]     Disposition: Home  Discharge instruction: The patient was instructed to be ambulatory but told to refrain from heavy lifting, strenuous activity, or driving.   Discharge medications:  Allergies as of 03/16/2020      Reactions   Nitric Oxide [nitrogen Oxide]    Sulfamethoxazole-trimethoprim  Medication List    STOP taking these medications   atovaquone-proguanil 250-100 MG Tabs tablet Commonly known as: MALARONE   ciprofloxacin 500 MG tablet Commonly known as: Cipro   zolpidem 10 MG tablet Commonly known as:  AMBIEN     TAKE these medications   B COMPLEX-C PO Take 1 tablet by mouth daily.   CINNAMON PO Take by mouth daily.   Coenzyme Q10 50 MG Caps Take 1 capsule by mouth at bedtime.   Evolocumab 140 MG/ML Soaj Inject into the skin every 21 ( twenty-one) days.   Fish Oil 1000 MG Caps Take 3 capsules by mouth daily.   levofloxacin 500 MG tablet Commonly known as: LEVAQUIN Take 500 mg by mouth daily.   MAG-200 PO Take 1 tablet by mouth daily.   MULTIPLE VITAMIN PO Take 1 tablet by mouth daily.   olmesartan 20 MG tablet Commonly known as: BENICAR Take 20 mg by mouth every evening.   OVER THE COUNTER MEDICATION Take 1 tablet by mouth daily. tumeric   tamsulosin 0.4 MG Caps capsule Commonly known as: FLOMAX Take 1 capsule (0.4 mg total) by mouth daily. What changed: when to take this   vitamin C 1000 MG tablet Take 1,000 mg by mouth daily.   vitamin E 45 MG (100 UNITS) capsule Take 100 Units by mouth daily.       Followup:   Follow-up Information    [facility].   Contact information: [place] [phone]

## 2020-03-16 NOTE — Discharge Instructions (Signed)
Post clot evacuation/mini-TURP  Your recent prostate surgery requires very special post hospital care. Despite the fact that no skin incisions were used the area around the prostate incision is quite raw and is covered with a scab to promote healing and prevent bleeding. Certain cautions are needed to assure that the scab is not disturbed of the next 2-3 weeks while the healing proceeds.  Because the raw surface in your prostate and the irritating effects of urine you may expect frequency of urination and/or urgency (a stronger desire to urinate) and perhaps even getting up at night more often. This will usually resolve or improve slowly over the healing period. You may see some blood in your urine over the first 6 weeks. Do not be alarmed, even if the urine was clear for a while. Get off your feet and drink lots of fluids until clearing occurs. If you start to pass clots or don't improve call us.  Catheter: (If you are discharged with a catheter.) 1. Keep your catheter secured to your leg at all times with tape or the supplied strap. 2. You may experience leakage of urine around your catheter- as long as the  catheter continues to drain, this is normal.  If your catheter stops draining  go to the ER. 3. You may also have blood in your urine, even after it has been clear for  several days; you may even pass some small blood clots or other material.  This  is normal as well.  If this happens, sit down and drink plenty of water to help  make urine to flush out your bladder.  If the blood in your urine becomes worse  after doing this, contact our office or return to the ER. 4. You may use the leg bag (small bag) during the day, but use the large bag at  night.  Diet:  You may return to your normal diet immediately. Because of the raw surface of your bladder, alcohol, spicy foods, foods high in acid and drinks with caffeine may cause irritation or frequency and should be used in moderation. To keep  your urine flowing freely and avoid constipation, drink plenty of fluids during the day (8-10 glasses). Tip: Avoid cranberry juice because it is very acidic.  Activity:  Your physical activity doesn't need to be restricted. However, if you are very active, you may see some blood in the urine. We suggest that you reduce your activity under the circumstances until the bleeding has stopped.  Bowels:  It is important to keep your bowels regular during the postoperative period. Straining with bowel movements can cause bleeding. A bowel movement every other day is reasonable. Use a mild laxative if needed, such as milk of magnesia 2-3 tablespoons, or 2 Dulcolax tablets. Call if you continue to have problems. If you had been taking narcotics for pain, before, during or after your surgery, you may be constipated. Take a laxative if necessary.  Medication:  You should resume your pre-surgery medications unless told not to. DO NOT RESUME YOUR ASPIRIN, WARFARIN, OR OTHER BLOOD THINNER FOR 1 WEEK. In addition you may be given an antibiotic to prevent or treat infection. Antibiotics are not always necessary. All medication should be taken as prescribed until the bottles are finished unless you are having an unusual reaction to one of the drugs.     Problems you should report to [place]:  a. Fever greater than 101F. b. Heavy bleeding, or clots (see notes above about blood in  urine). c. Inability to urinate. d. Drug reactions (hives, rash, nausea, vomiting, diarrhea). e. Severe burning or pain with urination that is not improving.

## 2020-03-16 NOTE — Discharge Planning (Signed)
Pt provided with 2 leg bags for foley as well as overnight/larger bag. Pt educated on proper hygiene, maintenance & care of foley. All questions & concerns addressed.   CBI port capped & provided with extra cap for backup.

## 2020-03-17 LAB — SURGICAL PATHOLOGY

## 2020-03-20 DIAGNOSIS — H524 Presbyopia: Secondary | ICD-10-CM | POA: Diagnosis not present

## 2020-03-20 LAB — CULTURE, BLOOD (ROUTINE X 2)
Culture: NO GROWTH
Culture: NO GROWTH
Special Requests: ADEQUATE

## 2020-04-07 DIAGNOSIS — N3000 Acute cystitis without hematuria: Secondary | ICD-10-CM | POA: Diagnosis not present

## 2020-04-07 DIAGNOSIS — R3912 Poor urinary stream: Secondary | ICD-10-CM | POA: Diagnosis not present

## 2020-04-07 DIAGNOSIS — N401 Enlarged prostate with lower urinary tract symptoms: Secondary | ICD-10-CM | POA: Diagnosis not present

## 2020-04-18 DIAGNOSIS — N3000 Acute cystitis without hematuria: Secondary | ICD-10-CM | POA: Diagnosis not present

## 2020-04-18 DIAGNOSIS — R338 Other retention of urine: Secondary | ICD-10-CM | POA: Diagnosis not present

## 2020-05-06 DIAGNOSIS — H524 Presbyopia: Secondary | ICD-10-CM | POA: Diagnosis not present

## 2020-06-05 DIAGNOSIS — Z85828 Personal history of other malignant neoplasm of skin: Secondary | ICD-10-CM | POA: Diagnosis not present

## 2020-06-05 DIAGNOSIS — D225 Melanocytic nevi of trunk: Secondary | ICD-10-CM | POA: Diagnosis not present

## 2020-06-05 DIAGNOSIS — L814 Other melanin hyperpigmentation: Secondary | ICD-10-CM | POA: Diagnosis not present

## 2020-06-05 DIAGNOSIS — L821 Other seborrheic keratosis: Secondary | ICD-10-CM | POA: Diagnosis not present

## 2020-07-20 DIAGNOSIS — R972 Elevated prostate specific antigen [PSA]: Secondary | ICD-10-CM | POA: Diagnosis not present

## 2020-07-20 DIAGNOSIS — N3000 Acute cystitis without hematuria: Secondary | ICD-10-CM | POA: Diagnosis not present

## 2020-07-20 DIAGNOSIS — N401 Enlarged prostate with lower urinary tract symptoms: Secondary | ICD-10-CM | POA: Diagnosis not present

## 2020-07-20 DIAGNOSIS — R3912 Poor urinary stream: Secondary | ICD-10-CM | POA: Diagnosis not present

## 2020-08-17 DIAGNOSIS — R3912 Poor urinary stream: Secondary | ICD-10-CM | POA: Diagnosis not present

## 2020-08-17 DIAGNOSIS — N3000 Acute cystitis without hematuria: Secondary | ICD-10-CM | POA: Diagnosis not present

## 2020-08-29 DIAGNOSIS — R7301 Impaired fasting glucose: Secondary | ICD-10-CM | POA: Diagnosis not present

## 2020-08-29 DIAGNOSIS — N400 Enlarged prostate without lower urinary tract symptoms: Secondary | ICD-10-CM | POA: Diagnosis not present

## 2020-08-29 DIAGNOSIS — E785 Hyperlipidemia, unspecified: Secondary | ICD-10-CM | POA: Diagnosis not present

## 2020-08-29 DIAGNOSIS — I10 Essential (primary) hypertension: Secondary | ICD-10-CM | POA: Diagnosis not present

## 2020-09-05 DIAGNOSIS — R82998 Other abnormal findings in urine: Secondary | ICD-10-CM | POA: Diagnosis not present

## 2020-09-05 DIAGNOSIS — Z1212 Encounter for screening for malignant neoplasm of rectum: Secondary | ICD-10-CM | POA: Diagnosis not present

## 2020-09-05 DIAGNOSIS — I10 Essential (primary) hypertension: Secondary | ICD-10-CM | POA: Diagnosis not present

## 2020-09-13 ENCOUNTER — Other Ambulatory Visit: Payer: Self-pay | Admitting: Internal Medicine

## 2020-09-13 DIAGNOSIS — K7689 Other specified diseases of liver: Secondary | ICD-10-CM

## 2020-09-14 DIAGNOSIS — R3912 Poor urinary stream: Secondary | ICD-10-CM | POA: Diagnosis not present

## 2020-09-14 DIAGNOSIS — R3914 Feeling of incomplete bladder emptying: Secondary | ICD-10-CM | POA: Diagnosis not present

## 2020-09-14 DIAGNOSIS — N401 Enlarged prostate with lower urinary tract symptoms: Secondary | ICD-10-CM | POA: Diagnosis not present

## 2020-09-28 ENCOUNTER — Ambulatory Visit
Admission: RE | Admit: 2020-09-28 | Discharge: 2020-09-28 | Disposition: A | Discharge status: Home / Self Care | Payer: Medicare Other | Source: Ambulatory Visit | Attending: Internal Medicine | Admitting: Internal Medicine

## 2020-09-28 DIAGNOSIS — K7689 Other specified diseases of liver: Secondary | ICD-10-CM | POA: Diagnosis not present

## 2020-09-28 DIAGNOSIS — K760 Fatty (change of) liver, not elsewhere classified: Secondary | ICD-10-CM | POA: Diagnosis not present

## 2020-10-27 DIAGNOSIS — R3914 Feeling of incomplete bladder emptying: Secondary | ICD-10-CM | POA: Diagnosis not present

## 2020-10-27 DIAGNOSIS — N320 Bladder-neck obstruction: Secondary | ICD-10-CM | POA: Diagnosis not present

## 2020-10-27 DIAGNOSIS — N401 Enlarged prostate with lower urinary tract symptoms: Secondary | ICD-10-CM | POA: Diagnosis not present

## 2020-10-31 ENCOUNTER — Other Ambulatory Visit: Payer: Self-pay | Admitting: Urology

## 2020-11-10 ENCOUNTER — Encounter (HOSPITAL_BASED_OUTPATIENT_CLINIC_OR_DEPARTMENT_OTHER): Payer: Self-pay | Admitting: Urology

## 2020-11-10 ENCOUNTER — Other Ambulatory Visit: Payer: Self-pay

## 2020-11-10 DIAGNOSIS — Z973 Presence of spectacles and contact lenses: Secondary | ICD-10-CM

## 2020-11-10 HISTORY — DX: Presence of spectacles and contact lenses: Z97.3

## 2020-11-10 NOTE — Progress Notes (Addendum)
Spoke w/ via phone for pre-op interview---pt Lab needs dos----   I stat             Lab results------ekg 03-14-2020 chart/ epic, head ct 03-14-2020 epic, gated stress test 07-10-2017  chart/epic COVID test -----patient states asymptomatic no test needed Arrive at -------1045 am 11-16-2020 NPO after MN NO Solid Food.  Clear liquids from MN until---945 am Med rec completed Medications to take morning of surgery -----none Diabetic medication -----n/a Patient instructed to bring photo id and insurance card day of surgery Patient aware to have Driver (ride ) / caregiver   spouse deborah for 24 hours after surgery  Patient Special Instructions -----none Pre-Op special Istructions -----none Patient verbalized understanding of instructions that were given at this phone interview. Patient denies shortness of breath, chest pain, fever, cough at this phone interview.   Pt stopped 81 mg aspirin on 11-09-2020 per dr  w Lutricia Feil instructions on chart

## 2020-11-16 ENCOUNTER — Ambulatory Visit (HOSPITAL_BASED_OUTPATIENT_CLINIC_OR_DEPARTMENT_OTHER)
Admission: RE | Admit: 2020-11-16 | Discharge: 2020-11-16 | Disposition: A | Discharge status: Home / Self Care | Payer: Medicare Other | Attending: Urology | Admitting: Urology

## 2020-11-16 ENCOUNTER — Encounter (HOSPITAL_BASED_OUTPATIENT_CLINIC_OR_DEPARTMENT_OTHER)
Admission: RE | Disposition: A | Discharge status: Home / Self Care | Payer: Self-pay | Source: Home / Self Care | Attending: Urology

## 2020-11-16 ENCOUNTER — Encounter (HOSPITAL_BASED_OUTPATIENT_CLINIC_OR_DEPARTMENT_OTHER): Payer: Self-pay | Admitting: Urology

## 2020-11-16 ENCOUNTER — Ambulatory Visit (HOSPITAL_BASED_OUTPATIENT_CLINIC_OR_DEPARTMENT_OTHER): Payer: Medicare Other | Admitting: Anesthesiology

## 2020-11-16 ENCOUNTER — Other Ambulatory Visit: Payer: Self-pay

## 2020-11-16 DIAGNOSIS — Z79899 Other long term (current) drug therapy: Secondary | ICD-10-CM | POA: Insufficient documentation

## 2020-11-16 DIAGNOSIS — N401 Enlarged prostate with lower urinary tract symptoms: Secondary | ICD-10-CM | POA: Insufficient documentation

## 2020-11-16 DIAGNOSIS — N138 Other obstructive and reflux uropathy: Secondary | ICD-10-CM | POA: Diagnosis not present

## 2020-11-16 DIAGNOSIS — R339 Retention of urine, unspecified: Secondary | ICD-10-CM | POA: Diagnosis not present

## 2020-11-16 DIAGNOSIS — R3912 Poor urinary stream: Secondary | ICD-10-CM | POA: Diagnosis not present

## 2020-11-16 DIAGNOSIS — N320 Bladder-neck obstruction: Secondary | ICD-10-CM | POA: Insufficient documentation

## 2020-11-16 DIAGNOSIS — R351 Nocturia: Secondary | ICD-10-CM | POA: Diagnosis not present

## 2020-11-16 DIAGNOSIS — D649 Anemia, unspecified: Secondary | ICD-10-CM | POA: Diagnosis not present

## 2020-11-16 DIAGNOSIS — N400 Enlarged prostate without lower urinary tract symptoms: Secondary | ICD-10-CM | POA: Diagnosis not present

## 2020-11-16 DIAGNOSIS — E785 Hyperlipidemia, unspecified: Secondary | ICD-10-CM | POA: Diagnosis not present

## 2020-11-16 HISTORY — DX: Other complications of anesthesia, initial encounter: T88.59XA

## 2020-11-16 HISTORY — PX: TRANSURETHRAL RESECTION OF PROSTATE: SHX73

## 2020-11-16 HISTORY — DX: Personal history of other diseases of the digestive system: Z87.19

## 2020-11-16 HISTORY — DX: Bladder-neck obstruction: N32.0

## 2020-11-16 HISTORY — DX: Personal history of other diseases of the nervous system and sense organs: Z86.69

## 2020-11-16 HISTORY — PX: CYSTOSCOPY: SHX5120

## 2020-11-16 HISTORY — PX: TRANSURETHRAL INCISION OF BLADDER NECK: SHX6152

## 2020-11-16 LAB — POCT I-STAT, CHEM 8
BUN: 19 mg/dL (ref 8–23)
Calcium, Ion: 1.06 mmol/L — ABNORMAL LOW (ref 1.15–1.40)
Chloride: 108 mmol/L (ref 98–111)
Creatinine, Ser: 1 mg/dL (ref 0.61–1.24)
Glucose, Bld: 107 mg/dL — ABNORMAL HIGH (ref 70–99)
HCT: 35 % — ABNORMAL LOW (ref 39.0–52.0)
Hemoglobin: 11.9 g/dL — ABNORMAL LOW (ref 13.0–17.0)
Potassium: 3.9 mmol/L (ref 3.5–5.1)
Sodium: 140 mmol/L (ref 135–145)
TCO2: 23 mmol/L (ref 22–32)

## 2020-11-16 SURGERY — INCISION, BLADDER NECK, TRANSURETHRAL
Anesthesia: General | Site: Prostate

## 2020-11-16 MED ORDER — ONDANSETRON HCL 4 MG/2ML IJ SOLN
INTRAMUSCULAR | Status: DC | PRN
  Administered 2020-11-16: 4 mg via INTRAVENOUS

## 2020-11-16 MED ORDER — HYDROCODONE-ACETAMINOPHEN 5-325 MG PO TABS: 1.0000 | ORAL_TABLET | ORAL | 0 refills | Status: AC | PRN

## 2020-11-16 MED ORDER — ONDANSETRON HCL 4 MG/2ML IJ SOLN
INTRAMUSCULAR | Status: AC
  Filled 2020-11-16: qty 2

## 2020-11-16 MED ORDER — FENTANYL CITRATE (PF) 100 MCG/2ML IJ SOLN
INTRAMUSCULAR | Status: DC | PRN
  Administered 2020-11-16: 100 ug via INTRAVENOUS

## 2020-11-16 MED ORDER — DEXAMETHASONE SODIUM PHOSPHATE 10 MG/ML IJ SOLN
INTRAMUSCULAR | Status: DC | PRN
  Administered 2020-11-16: 10 mg via INTRAVENOUS

## 2020-11-16 MED ORDER — OXYCODONE HCL 5 MG/5ML PO SOLN: 5.0000 mg | Freq: Once | ORAL | Status: DC | PRN

## 2020-11-16 MED ORDER — PROPOFOL 10 MG/ML IV BOLUS
INTRAVENOUS | Status: AC
  Filled 2020-11-16: qty 20

## 2020-11-16 MED ORDER — LIDOCAINE 2% (20 MG/ML) 5 ML SYRINGE
INTRAMUSCULAR | Status: DC | PRN
  Administered 2020-11-16: 100 mg via INTRAVENOUS

## 2020-11-16 MED ORDER — CEPHALEXIN 250 MG PO CAPS: 250.0000 mg | ORAL_CAPSULE | Freq: Every day | ORAL | 0 refills | Status: AC

## 2020-11-16 MED ORDER — FENTANYL CITRATE (PF) 100 MCG/2ML IJ SOLN: 25.0000 ug | INTRAMUSCULAR | Status: DC | PRN

## 2020-11-16 MED ORDER — FENTANYL CITRATE (PF) 100 MCG/2ML IJ SOLN
INTRAMUSCULAR | Status: AC
  Filled 2020-11-16: qty 2

## 2020-11-16 MED ORDER — PROPOFOL 10 MG/ML IV BOLUS
INTRAVENOUS | Status: DC | PRN
  Administered 2020-11-16: 150 mg via INTRAVENOUS

## 2020-11-16 MED ORDER — ACETAMINOPHEN 160 MG/5ML PO SOLN: 325.0000 mg | ORAL | Status: DC | PRN

## 2020-11-16 MED ORDER — 0.9 % SODIUM CHLORIDE (POUR BTL) OPTIME
TOPICAL | Status: DC | PRN
  Administered 2020-11-16: 500 mL

## 2020-11-16 MED ORDER — LACTATED RINGERS IV SOLN
INTRAVENOUS | Status: DC
  Administered 2020-11-16: 1000 mL via INTRAVENOUS

## 2020-11-16 MED ORDER — EPHEDRINE SULFATE-NACL 50-0.9 MG/10ML-% IV SOSY
PREFILLED_SYRINGE | INTRAVENOUS | Status: DC | PRN
  Administered 2020-11-16 (×2): 10 mg via INTRAVENOUS
  Administered 2020-11-16: 5 mg via INTRAVENOUS

## 2020-11-16 MED ORDER — CEFAZOLIN SODIUM-DEXTROSE 2-4 GM/100ML-% IV SOLN
INTRAVENOUS | Status: AC
  Filled 2020-11-16: qty 100

## 2020-11-16 MED ORDER — CEFAZOLIN SODIUM-DEXTROSE 2-4 GM/100ML-% IV SOLN
2.0000 g | INTRAVENOUS | Status: AC
  Administered 2020-11-16: 2 g via INTRAVENOUS

## 2020-11-16 MED ORDER — OXYCODONE HCL 5 MG PO TABS: 5.0000 mg | ORAL_TABLET | Freq: Once | ORAL | Status: DC | PRN

## 2020-11-16 MED ORDER — ACETAMINOPHEN 325 MG PO TABS: 325.0000 mg | ORAL_TABLET | ORAL | Status: DC | PRN

## 2020-11-16 MED ORDER — EPHEDRINE 5 MG/ML INJ
INTRAVENOUS | Status: AC
  Filled 2020-11-16: qty 5

## 2020-11-16 MED ORDER — LIDOCAINE 2% (20 MG/ML) 5 ML SYRINGE
INTRAMUSCULAR | Status: AC
  Filled 2020-11-16: qty 5

## 2020-11-16 MED ORDER — ONDANSETRON HCL 4 MG/2ML IJ SOLN
4.0000 mg | Freq: Once | INTRAMUSCULAR | Status: DC | PRN
Start: 2020-11-16 — End: 2020-11-16

## 2020-11-16 MED ORDER — MEPERIDINE HCL 25 MG/ML IJ SOLN: 6.2500 mg | INTRAMUSCULAR | Status: DC | PRN

## 2020-11-16 MED ORDER — DEXAMETHASONE SODIUM PHOSPHATE 10 MG/ML IJ SOLN
INTRAMUSCULAR | Status: AC
  Filled 2020-11-16: qty 1

## 2020-11-16 MED ORDER — SODIUM CHLORIDE 0.9 % IR SOLN
Status: DC | PRN
  Administered 2020-11-16: 6000 mL via INTRAVESICAL
  Administered 2020-11-16: 1000 mL via INTRAVESICAL

## 2020-11-16 SURGICAL SUPPLY — 22 items
BAG DRAIN URO-CYSTO SKYTR STRL (DRAIN) ×3 IMPLANT
BAG DRN RND TRDRP ANRFLXCHMBR (UROLOGICAL SUPPLIES) ×2
BAG DRN UROCATH (DRAIN) ×2
BAG URINE DRAIN 2000ML AR STRL (UROLOGICAL SUPPLIES) ×3 IMPLANT
CATH COUDE FOLEY 2W 5CC 22FR (CATHETERS) ×1 IMPLANT
CATH FOLEY 3WAY 30CC 22FR (CATHETERS) ×3 IMPLANT
CATH HEMA 3WAY 30CC 22FR COUDE (CATHETERS) ×3 IMPLANT
CLOTH BEACON ORANGE TIMEOUT ST (SAFETY) ×3 IMPLANT
GLOVE SURG ENC MOIS LTX SZ6.5 (GLOVE) ×3 IMPLANT
GOWN STRL REUS W/TWL LRG LVL3 (GOWN DISPOSABLE) ×3 IMPLANT
GUIDEWIRE STR DUAL SENSOR (WIRE) ×2 IMPLANT
HOLDER FOLEY CATH W/STRAP (MISCELLANEOUS) ×3 IMPLANT
IV NS IRRIG 3000ML ARTHROMATIC (IV SOLUTION) ×8 IMPLANT
KIT TURNOVER CYSTO (KITS) ×3 IMPLANT
LOOP CUT BIPOLAR 24F LRG (ELECTROSURGICAL) ×3 IMPLANT
MANIFOLD NEPTUNE II (INSTRUMENTS) ×3 IMPLANT
NS IRRIG 500ML POUR BTL (IV SOLUTION) ×1 IMPLANT
PACK CYSTO (CUSTOM PROCEDURE TRAY) ×3 IMPLANT
SYR TOOMEY IRRIG 70ML (MISCELLANEOUS) ×3
SYRINGE TOOMEY IRRIG 70ML (MISCELLANEOUS) ×2 IMPLANT
TUBE CONNECTING 12X1/4 (SUCTIONS) ×3 IMPLANT
TUBING UROLOGY SET (TUBING) ×3 IMPLANT

## 2020-11-16 NOTE — Interval H&P Note (Signed)
History and Physical Interval Note:  11/16/2020 12:18 PM  Zachary Gallegos  has presented today for surgery, with the diagnosis of [place].  The various methods of treatment have been discussed with the patient and family. After consideration of risks, benefits and other options for treatment, the patient has consented to  Procedure(s) with comments: [place] (N/A) - 1 HR TRANSURETHRAL RESECTION OF THE PROSTATE (TURP) (N/A) CYSTOSCOPY (N/A) as a surgical intervention.  The patient's history has been reviewed, patient examined, no change in status, stable for surgery.  I have reviewed the patient's chart and labs.  Questions were answered to the patient's satisfaction.     Zachary Gallegos D Dauntae Derusha

## 2020-11-16 NOTE — Anesthesia Preprocedure Evaluation (Signed)
Anesthesia Evaluation  Patient identified by MRN, date of birth, ID band Patient awake    Reviewed: Allergy & Precautions, H&P , NPO status , Patient's Chart, lab work & pertinent test results  History of Anesthesia Complications Negative for: history of anesthetic complications  Airway Mallampati: I  TM Distance: >3 FB Neck ROM: Full    Dental no notable dental hx. (+) Teeth Intact, Dental Advisory Given   Pulmonary sleep apnea (s/p UPPP) ,    Pulmonary exam normal breath sounds clear to auscultation       Cardiovascular hypertension, Pt. on medications + Peripheral Vascular Disease  Normal cardiovascular exam Rhythm:Regular Rate:Normal  Nuclear stress 07/2017: EF 46%, low risk study    Neuro/Psych negative psych ROS   GI/Hepatic negative GI ROS, Neg liver ROS,   Endo/Other  negative endocrine ROS  Renal/GU negative Renal ROS   Hematuria negative genitourinary   Musculoskeletal negative musculoskeletal ROS (+)   Abdominal Normal abdominal exam  (+)   Peds  Hematology  (+) Blood dyscrasia, anemia , Hgb 7.9   Anesthesia Other Findings Day of surgery medications reviewed with patient.  Reproductive/Obstetrics negative OB ROS                             Anesthesia Physical  Anesthesia Plan  ASA: II  Anesthesia Plan: General   Post-op Pain Management:    Induction: Intravenous  PONV Risk Score and Plan: 3 and Treatment may vary due to age or medical condition, Ondansetron and Dexamethasone  Airway Management Planned: LMA  Additional Equipment: None  Intra-op Plan:   Post-operative Plan: Extubation in OR  Informed Consent: I have reviewed the patients History and Physical, chart, labs and discussed the procedure including the risks, benefits and alternatives for the proposed anesthesia with the patient or authorized representative who has indicated his/her understanding and  acceptance.     Dental advisory given  Plan Discussed with: CRNA  Anesthesia Plan Comments:         Anesthesia Quick Evaluation

## 2020-11-16 NOTE — Anesthesia Procedure Notes (Signed)
Procedure Name: LMA Insertion Date/Time: 11/16/2020 12:46 PM Performed by: Rogers Blocker, CRNA Pre-anesthesia Checklist: Patient identified, Emergency Drugs available, Suction available and Patient being monitored Patient Re-evaluated:Patient Re-evaluated prior to induction Oxygen Delivery Method: Circle System Utilized Preoxygenation: Pre-oxygenation with 100% oxygen Induction Type: IV induction Ventilation: Mask ventilation without difficulty LMA: LMA inserted LMA Size: 5.0 Number of attempts: 1 Placement Confirmation: positive ETCO2 Tube secured with: Tape Dental Injury: Teeth and Oropharynx as per pre-operative assessment

## 2020-11-16 NOTE — Anesthesia Postprocedure Evaluation (Signed)
Anesthesia Post Note  Patient: Zachary Gallegos  Procedure(s) Performed: TRANSURETHRAL INCISION OF BLADDER NECK (Bladder) TRANSURETHRAL RESECTION OF THE PROSTATE (TURP) REGROWTH (Prostate) CYSTOSCOPY (Bladder)     Patient location during evaluation: Phase II Anesthesia Type: General Level of consciousness: awake Pain management: pain level controlled Vital Signs Assessment: post-procedure vital signs reviewed and stable Respiratory status: spontaneous breathing Cardiovascular status: stable Postop Assessment: no apparent nausea or vomiting Anesthetic complications: no   No notable events documented.  Last Vitals:  Vitals:   11/16/20 1345 11/16/20 1400  BP: (!) 146/87 (!) 144/90  Pulse: 64 67  Resp: 15 15  Temp:    SpO2: 97% 98%    Last Pain:  Vitals:   11/16/20 1400  TempSrc:   PainSc: 0-No pain                 John F Salome Arnt

## 2020-11-16 NOTE — Discharge Instructions (Addendum)
Post bladder neck incision instructions  Your recent prostate surgery requires very special post hospital care. Despite the fact that no skin incisions were used the area around the prostate incision is quite raw and is covered with a scab to promote healing and prevent bleeding. Certain cautions are needed to assure that the scab is not disturbed of the next 2-3 weeks while the healing proceeds.  Because the raw surface in your prostate and the irritating effects of urine you may expect frequency of urination and/or urgency (a stronger desire to urinate) and perhaps even getting up at night more often. This will usually resolve or improve slowly over the healing period. You may see some blood in your urine over the first 6 weeks. Do not be alarmed, even if the urine was clear for a while. Get off your feet and drink lots of fluids until clearing occurs. If you start to pass clots or don't improve call us.  Catheter: (If you are discharged with a catheter.) 1. Keep your catheter secured to your leg at all times with tape or the supplied strap. 2. You may experience leakage of urine around your catheter- as long as the  catheter continues to drain, this is normal.  If your catheter stops draining  go to the ER. 3. You may also have blood in your urine, even after it has been clear for  several days; you may even pass some small blood clots or other material.  This  is normal as well.  If this happens, sit down and drink plenty of water to help  make urine to flush out your bladder.  If the blood in your urine becomes worse  after doing this, contact our office or return to the ER. 4. You may use the leg bag (small bag) during the day, but use the large bag at  night.  Diet:  You may return to your normal diet immediately. Because of the raw surface of your bladder, alcohol, spicy foods, foods high in acid and drinks with caffeine may cause irritation or frequency and should be used in moderation.  To keep your urine flowing freely and avoid constipation, drink plenty of fluids during the day (8-10 glasses). Tip: Avoid cranberry juice because it is very acidic.  Activity:  Your physical activity doesn't need to be restricted. However, if you are very active, you may see some blood in the urine. We suggest that you reduce your activity under the circumstances until the bleeding has stopped.  Bowels:  It is important to keep your bowels regular during the postoperative period. Straining with bowel movements can cause bleeding. A bowel movement every other day is reasonable. Use a mild laxative if needed, such as milk of magnesia 2-3 tablespoons, or 2 Dulcolax tablets. Call if you continue to have problems. If you had been taking narcotics for pain, before, during or after your surgery, you may be constipated. Take a laxative if necessary.  Medication:  You should resume your pre-surgery medications unless told not to. DO NOT RESUME YOUR ASPIRIN, WARFARIN, OR OTHER BLOOD THINNER FOR 1 WEEK. In addition you may be given an antibiotic to prevent or treat infection. Antibiotics are not always necessary. All medication should be taken as prescribed until the bottles are finished unless you are having an unusual reaction to one of the drugs.     Problems you should report to [place]:  a. Fever greater than 101F. b. Heavy bleeding, or clots (see notes above about  blood in urine). c. Inability to urinate. d. Drug reactions (hives, rash, nausea, vomiting, diarrhea). e. Severe burning or pain with urination that is not improving.   Post Anesthesia Home Care Instructions  Activity: Get plenty of rest for the remainder of the day. A responsible adult should stay with you for 24 hours following the procedure.  For the next 24 hours, DO NOT: -Drive a car -Paediatric nurse -Drink alcoholic beverages -Take any medication unless instructed by your physician -Make any legal decisions or sign  important papers.  Meals: Start with liquid foods such as gelatin or soup. Progress to regular foods as tolerated. Avoid greasy, spicy, heavy foods. If nausea and/or vomiting occur, drink only clear liquids until the nausea and/or vomiting subsides. Call your physician if vomiting continues.  Special Instructions/Symptoms: Your throat may feel dry or sore from the anesthesia or the breathing tube placed in your throat during surgery. If this causes discomfort, gargle with warm salt water. The discomfort should disappear within 24 hours.  If you had a scopolamine patch placed behind your ear for the management of post- operative nausea and/or vomiting:  1. The medication in the patch is effective for 72 hours, after which it should be removed.  Wrap patch in a tissue and discard in the trash. Wash hands thoroughly with soap and water. 2. You may remove the patch earlier than 72 hours if you experience unpleasant side effects which may include dry mouth, dizziness or visual disturbances. 3. Avoid touching the patch. Wash your hands with soap and water after contact with the patch.

## 2020-11-16 NOTE — Op Note (Signed)
Preoperative diagnosis: Bladder neck contracture Prostatic regrowth  Postoperative diagnosis:  Bladder neck contracture Prostatic regrowth  Procedure:  Cystoscopy Bladder neck incision Repeat TURP  Surgeon: Jacalyn Lefevre, M.D.  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  His urinary stream significantly slowed and did not respond to flomax.  Cystoscopy revealed bladder nec contracture. After reviewing the management options for treatment, he elected to proceed with the above surgical procedure(s). We have discussed the potential benefits and risks of the procedure, side effects of the proposed treatment, the likelihood of the patient achieving the goals of the procedure, and any potential problems that might occur during the procedure or recuperation. Informed consent has been obtained.  Description of procedure:  The patient was taken to the operating room and general anesthesia was induced.  The patient was placed in the dorsal lithotomy position, prepped and draped in the usual sterile fashion, and preoperative antibiotics were administered. A preoperative time-out was performed.   Cystourethroscopy was performed.  The patient's urethra was examined and demonstrated mild prostatic regrowth right lobe with severe bladder neck contracture.    A 0.38 sensor wire was then advanced through the [facility] under direct visualization.  Next, the resectoscope using the bipolar knife was used to open the bladder neck at 5 and 7 oclock.    The bladder was then systematically examined in its entirety. There was no evidence of any bladder tumors, stones, or other mucosal pathology.  The ureteral orifices were identified.  The prostate regrowth was then resected with the bipolar loop.   Care  was taken not to resect distal to the verumontanum.   Hemostasis was then achieved with the cautery and the bladder was emptied and reinspected with no significant bleeding noted at the end of the procedure.    A 20Fr coude 2 way catheter was then placed into the bladder.  The patient appeared to tolerate the procedure well and without complications.  The patient was able to be awakened and transferred to the recovery unit in satisfactory condition.

## 2020-11-16 NOTE — H&P (Signed)
CC/HPI: cc: BPH with BOO    07/20/20: 70 year old man initially referred for an elevated PSA 4.2 found to have have acute urinary retention with approximately 1.5 L in his bladder. He then underwent Rezum however while he was self catheterizing developed gross hematuria with clot retention. He subsequently underwent TURP on 03/14/2020 and had a void trial on 04/04/2020. Since then he has been voiding on his own however feels like his urination has slow down over the last few months. He has stop taking Flomax due to lightheadedness. His PVR in the office today is 180 cc. His urinalysis appears consistent with infection today.   09/14/20: 70 year old man with a history of acute urinary retention who subsequently underwent TURP on 03/14/2020 returns for follow-up. He is able to void spontaneously but does have to massage his suprapubic area to empty. He has nocturia x1 in voids proximally 200 cc. He does not feel like he has ever fully empty. PVR in the office 156 mL. His stream following that TURP was very strong but has weekend over the last year. He did have a PSA done by his PCP which was 0.48. He is not taking any Flomax.   10/27/20: 70 year old man with above history here for cystoscopy and symptom reassessment after restarting Flomax. He initially did well after his TURP however symptoms have slowly returned. No change on Flomax. He has to continue to massage his suprapubic area to fully empty.     ALLERGIES: No Known Drug Allergies    MEDICATIONS: Tamsulosin Hcl 0.4 mg capsule 1 capsule PO Q HS  Viagra  Co Q10  Fish Oil  Multivitamin  Olmesartan Medoxomil 20 mg tablet  Repatha Pushtronex  Turmeric     GU PSH: Catheterize For Residual - 03/14/2020 Complex cystometrogram, w/ void pressure and urethral pressure profile studies, any technique - 12/23/2019 Complex Uroflow - 12/23/2019 Cystoscopy Irrigate Clot - 03/15/2020 Cystoscopy TURP - 03/15/2020 Emg surf Electrd - 12/23/2019 Inject For  cystogram - 12/23/2019 Intrabd voidng Press - 12/23/2019 Trurl Dstrj Prst8 Tiss Rf Wv - 02/14/2020       PSH Notes: apnea   NON-GU PSH: Ankle Arthroscopy/surgery     GU PMH: BPH w/LUTS - 09/14/2020, - 07/20/2020, - 04/18/2020, - 04/07/2020, - 04/04/2020, - 03/14/2020, - 03/07/2020, - 02/14/2020, - 01/13/2020, - 12/23/2019, - 10/12/2019 Incomplete bladder emptying - 09/14/2020 Weak Urinary Stream - 09/14/2020, - 08/17/2020, - 07/20/2020, - 04/07/2020, - 02/14/2020, - 01/13/2020, - 10/12/2019 Acute Cystitis/UTI - 08/17/2020, - 07/20/2020, - 03/14/2020, - 03/07/2020, - 02/29/2020, Will send patient's urine for culture and treat him empirically for UTI. Keflex 500 mg t.i.d. was sent to the pharmacy for him. If he is not experiencing any improvement in the next 24-48 hours he will call the office for consideration of antibiotic change. , - 10/25/2019 Elevated PSA - 07/20/2020, repeat PSA in 3 months, - 01/13/2020, Patient's PSA may not be accurate as he has had recent UTI and has acute urinary retention right now. Will repeat PSA in several weeks to see if it decreases., - 10/12/2019 Epididymo-orchitis - 04/18/2020, - 04/07/2020 Urinary Retention - 04/18/2020, - 04/04/2020, - 03/14/2020, - 03/07/2020, - 02/14/2020, - 01/13/2020, - 12/23/2019, Foley catheter placed in the office today and 1400 mL of urine drained. No signs of infection on urinalysis today. I started patient on tamsulosin 0.4 mg daily. He will return in 1 week for void trial., - 10/12/2019    NON-GU PMH: Hypercholesterolemia Hypertension Sleep Apnea    FAMILY HISTORY: kidney stone -  Father   SOCIAL HISTORY: Marital Status: Married Preferred Language: English; Ethnicity: Not Hispanic Or Latino; Race: White Current Smoking Status: Patient has never smoked.   Tobacco Use Assessment Completed: Used Tobacco in last 30 days? Social Drinker.  Drinks 3 caffeinated drinks per day.    REVIEW OF SYSTEMS:    GU Review Male:   Patient denies frequent urination, hard to postpone  urination, burning/ pain with urination, get up at night to urinate, leakage of urine, stream starts and stops, trouble starting your stream, have to strain to urinate , erection problems, and penile pain.  Gastrointestinal (Upper):   Patient denies nausea, vomiting, and indigestion/ heartburn.  Gastrointestinal (Lower):   Patient denies diarrhea and constipation.  Constitutional:   Patient denies fatigue, weight loss, fever, and night sweats.  Skin:   Patient denies skin rash/ lesion and itching.  Eyes:   Patient denies blurred vision and double vision.  Ears/ Nose/ Throat:   Patient denies sore throat and sinus problems.  Hematologic/Lymphatic:   Patient denies swollen glands and easy bruising.  Cardiovascular:   Patient denies leg swelling and chest pains.  Respiratory:   Patient denies cough and shortness of breath.  Endocrine:   Patient denies excessive thirst.  Musculoskeletal:   Patient denies back pain and joint pain.  Neurological:   Patient denies headaches and dizziness.  Psychologic:   Patient denies depression and anxiety.   VITAL SIGNS: None   MULTI-SYSTEM PHYSICAL EXAMINATION:    Constitutional: Well-nourished. No physical deformities. Normally developed. Good grooming.  Neck: Neck symmetrical, not swollen. Normal tracheal position.  Respiratory: No labored breathing, no use of accessory muscles.   Skin: No paleness, no jaundice, no cyanosis. No lesion, no ulcer, no rash.  Neurologic / Psychiatric: Oriented to time, oriented to place, oriented to person. No depression, no anxiety, no agitation.  Gastrointestinal: No rigidity, non obese abdomen.   Eyes: Normal conjunctivae. Normal eyelids.  Ears, Nose, Mouth, and Throat: Left ear no scars, no lesions, no masses. Right ear no scars, no lesions, no masses. Nose no scars, no lesions, no masses. Normal hearing. Normal lips.  Musculoskeletal: Normal gait and station of head and neck.     Complexity of Data:  Records Review:    Previous Patient Records  Urine Test Review:   Urinalysis   PROCEDURES:         Flexible Cystoscopy - 52000  Risks, benefits, and some of the potential complications of the procedure were discussed at length with the patient including infection, bleeding, voiding discomfort, urinary retention, fever, chills, sepsis, and others. All questions were answered. Informed consent was obtained. Sterile technique and intraurethral analgesia were used.  Prostate:  Prior evidence of TUR  Bladder Neck:  very small opening at bladder neck with surrounding blader neck contracture      The lower urinary tract was carefully examined. Cystoscope could not traverse bladder neck due to bladder neck contracture. Findings are noted above. The procedure was well-tolerated and without complications. Instructions were given to call the office immediately for bloody urine, difficulty urinating, urinary retention, painful or frequent urination, fever, chills, nausea, vomiting or other illness. The patient stated that he understood these instructions and would comply with them.         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 Patient is able to empty his bladder by massaging. He is scheduled to go on a trip on Sunday balloon back in approximately week and half. We discussed proceeding with  cystoscopy with bladder neck incision/possible dilation and Foley catheter placement. Risks and benefits of procedure discussed with the patient in detail. He knows that he will need a Foley catheter for 3-5 days following the procedure.

## 2020-11-16 NOTE — Transfer of Care (Signed)
Immediate Anesthesia Transfer of Care Note  Patient: Zachary Gallegos  Procedure(s) Performed: TRANSURETHRAL INCISION OF BLADDER NECK (Bladder) TRANSURETHRAL RESECTION OF THE PROSTATE (TURP) REGROWTH (Prostate) CYSTOSCOPY (Bladder)  Patient Location: PACU  Anesthesia Type:General  Level of Consciousness: awake, alert , oriented and patient cooperative  Airway & Oxygen Therapy: Patient Spontanous Breathing  Post-op Assessment: Report given to RN and Post -op Vital signs reviewed and stable  Post vital signs: Reviewed and stable  Last Vitals:  Vitals Value Taken Time  BP    Temp    Pulse    Resp    SpO2      Last Pain:  Vitals:   11/16/20 1121  TempSrc: Oral  PainSc: 0-No pain      Patients Stated Pain Goal: 5 ([id number])  Complications: No notable events documented.

## 2020-11-17 ENCOUNTER — Encounter (HOSPITAL_BASED_OUTPATIENT_CLINIC_OR_DEPARTMENT_OTHER): Payer: Self-pay | Admitting: Urology

## 2020-11-17 LAB — SURGICAL PATHOLOGY

## 2020-12-18 DIAGNOSIS — H2513 Age-related nuclear cataract, bilateral: Secondary | ICD-10-CM | POA: Diagnosis not present

## 2021-01-08 DIAGNOSIS — R338 Other retention of urine: Secondary | ICD-10-CM | POA: Diagnosis not present

## 2021-02-13 DIAGNOSIS — M5459 Other low back pain: Secondary | ICD-10-CM | POA: Diagnosis not present

## 2021-02-13 DIAGNOSIS — M9904 Segmental and somatic dysfunction of sacral region: Secondary | ICD-10-CM | POA: Diagnosis not present

## 2021-02-13 DIAGNOSIS — M25551 Pain in right hip: Secondary | ICD-10-CM | POA: Diagnosis not present

## 2021-02-13 DIAGNOSIS — G620 Drug-induced polyneuropathy: Secondary | ICD-10-CM | POA: Diagnosis not present

## 2021-02-26 DIAGNOSIS — M25551 Pain in right hip: Secondary | ICD-10-CM | POA: Diagnosis not present

## 2021-02-26 DIAGNOSIS — M9906 Segmental and somatic dysfunction of lower extremity: Secondary | ICD-10-CM | POA: Diagnosis not present

## 2021-02-26 DIAGNOSIS — G620 Drug-induced polyneuropathy: Secondary | ICD-10-CM | POA: Diagnosis not present

## 2021-02-26 DIAGNOSIS — M9905 Segmental and somatic dysfunction of pelvic region: Secondary | ICD-10-CM | POA: Diagnosis not present

## 2021-03-13 DIAGNOSIS — M9906 Segmental and somatic dysfunction of lower extremity: Secondary | ICD-10-CM | POA: Diagnosis not present

## 2021-03-13 DIAGNOSIS — M25551 Pain in right hip: Secondary | ICD-10-CM | POA: Diagnosis not present

## 2021-03-13 DIAGNOSIS — M545 Low back pain, unspecified: Secondary | ICD-10-CM | POA: Diagnosis not present

## 2021-03-13 DIAGNOSIS — M9905 Segmental and somatic dysfunction of pelvic region: Secondary | ICD-10-CM | POA: Diagnosis not present

## 2021-03-27 DIAGNOSIS — M9905 Segmental and somatic dysfunction of pelvic region: Secondary | ICD-10-CM | POA: Diagnosis not present

## 2021-03-27 DIAGNOSIS — M25551 Pain in right hip: Secondary | ICD-10-CM | POA: Diagnosis not present

## 2021-03-27 DIAGNOSIS — M545 Low back pain, unspecified: Secondary | ICD-10-CM | POA: Diagnosis not present

## 2021-03-27 DIAGNOSIS — M9906 Segmental and somatic dysfunction of lower extremity: Secondary | ICD-10-CM | POA: Diagnosis not present

## 2021-04-11 DIAGNOSIS — H524 Presbyopia: Secondary | ICD-10-CM | POA: Diagnosis not present

## 2021-05-01 DIAGNOSIS — M9903 Segmental and somatic dysfunction of lumbar region: Secondary | ICD-10-CM | POA: Diagnosis not present

## 2021-05-01 DIAGNOSIS — M9906 Segmental and somatic dysfunction of lower extremity: Secondary | ICD-10-CM | POA: Diagnosis not present

## 2021-05-01 DIAGNOSIS — M545 Low back pain, unspecified: Secondary | ICD-10-CM | POA: Diagnosis not present

## 2021-05-01 DIAGNOSIS — M25551 Pain in right hip: Secondary | ICD-10-CM | POA: Diagnosis not present

## 2021-05-09 DIAGNOSIS — R3912 Poor urinary stream: Secondary | ICD-10-CM | POA: Diagnosis not present

## 2021-05-09 DIAGNOSIS — N401 Enlarged prostate with lower urinary tract symptoms: Secondary | ICD-10-CM | POA: Diagnosis not present

## 2021-05-15 DIAGNOSIS — H524 Presbyopia: Secondary | ICD-10-CM | POA: Diagnosis not present

## 2021-06-13 DIAGNOSIS — M25551 Pain in right hip: Secondary | ICD-10-CM | POA: Diagnosis not present

## 2021-06-13 DIAGNOSIS — M9906 Segmental and somatic dysfunction of lower extremity: Secondary | ICD-10-CM | POA: Diagnosis not present

## 2021-06-13 DIAGNOSIS — M545 Low back pain, unspecified: Secondary | ICD-10-CM | POA: Diagnosis not present

## 2021-06-13 DIAGNOSIS — M9903 Segmental and somatic dysfunction of lumbar region: Secondary | ICD-10-CM | POA: Diagnosis not present

## 2021-07-18 ENCOUNTER — Ambulatory Visit
Admission: RE | Admit: 2021-07-18 | Discharge: 2021-07-18 | Disposition: A | Discharge status: Home / Self Care | Payer: Medicare Other | Source: Ambulatory Visit | Attending: Sports Medicine | Admitting: Sports Medicine

## 2021-07-18 ENCOUNTER — Other Ambulatory Visit: Payer: Self-pay | Admitting: Sports Medicine

## 2021-07-18 DIAGNOSIS — M9906 Segmental and somatic dysfunction of lower extremity: Secondary | ICD-10-CM | POA: Diagnosis not present

## 2021-07-18 DIAGNOSIS — M25551 Pain in right hip: Secondary | ICD-10-CM | POA: Diagnosis not present

## 2021-07-18 DIAGNOSIS — M5459 Other low back pain: Secondary | ICD-10-CM | POA: Diagnosis not present

## 2021-07-18 DIAGNOSIS — M545 Low back pain, unspecified: Secondary | ICD-10-CM | POA: Diagnosis not present

## 2021-07-19 DIAGNOSIS — Z125 Encounter for screening for malignant neoplasm of prostate: Secondary | ICD-10-CM | POA: Diagnosis not present

## 2021-08-03 DIAGNOSIS — M545 Low back pain, unspecified: Secondary | ICD-10-CM | POA: Diagnosis not present

## 2021-08-03 DIAGNOSIS — M9903 Segmental and somatic dysfunction of lumbar region: Secondary | ICD-10-CM | POA: Diagnosis not present

## 2021-08-03 DIAGNOSIS — M25851 Other specified joint disorders, right hip: Secondary | ICD-10-CM | POA: Diagnosis not present

## 2021-08-03 DIAGNOSIS — M25551 Pain in right hip: Secondary | ICD-10-CM | POA: Diagnosis not present

## 2021-08-08 DIAGNOSIS — M5451 Vertebrogenic low back pain: Secondary | ICD-10-CM | POA: Diagnosis not present

## 2021-08-08 DIAGNOSIS — M25551 Pain in right hip: Secondary | ICD-10-CM | POA: Diagnosis not present

## 2021-08-08 DIAGNOSIS — M62830 Muscle spasm of back: Secondary | ICD-10-CM | POA: Diagnosis not present

## 2021-08-13 DIAGNOSIS — M25551 Pain in right hip: Secondary | ICD-10-CM | POA: Diagnosis not present

## 2021-08-13 DIAGNOSIS — M1611 Unilateral primary osteoarthritis, right hip: Secondary | ICD-10-CM | POA: Diagnosis not present

## 2021-08-14 DIAGNOSIS — M25551 Pain in right hip: Secondary | ICD-10-CM | POA: Diagnosis not present

## 2021-08-14 DIAGNOSIS — M62830 Muscle spasm of back: Secondary | ICD-10-CM | POA: Diagnosis not present

## 2021-08-14 DIAGNOSIS — M5451 Vertebrogenic low back pain: Secondary | ICD-10-CM | POA: Diagnosis not present

## 2021-08-27 DIAGNOSIS — M62830 Muscle spasm of back: Secondary | ICD-10-CM | POA: Diagnosis not present

## 2021-08-27 DIAGNOSIS — M25551 Pain in right hip: Secondary | ICD-10-CM | POA: Diagnosis not present

## 2021-08-27 DIAGNOSIS — M5451 Vertebrogenic low back pain: Secondary | ICD-10-CM | POA: Diagnosis not present

## 2021-08-29 DIAGNOSIS — M62830 Muscle spasm of back: Secondary | ICD-10-CM | POA: Diagnosis not present

## 2021-08-29 DIAGNOSIS — M25551 Pain in right hip: Secondary | ICD-10-CM | POA: Diagnosis not present

## 2021-08-29 DIAGNOSIS — M5451 Vertebrogenic low back pain: Secondary | ICD-10-CM | POA: Diagnosis not present

## 2021-10-01 DIAGNOSIS — M545 Low back pain, unspecified: Secondary | ICD-10-CM | POA: Diagnosis not present

## 2021-10-01 DIAGNOSIS — M9903 Segmental and somatic dysfunction of lumbar region: Secondary | ICD-10-CM | POA: Diagnosis not present

## 2021-10-01 DIAGNOSIS — M25551 Pain in right hip: Secondary | ICD-10-CM | POA: Diagnosis not present

## 2021-10-01 DIAGNOSIS — M9904 Segmental and somatic dysfunction of sacral region: Secondary | ICD-10-CM | POA: Diagnosis not present

## 2021-10-03 DIAGNOSIS — M25551 Pain in right hip: Secondary | ICD-10-CM | POA: Diagnosis not present

## 2021-10-03 DIAGNOSIS — M5451 Vertebrogenic low back pain: Secondary | ICD-10-CM | POA: Diagnosis not present

## 2021-10-03 DIAGNOSIS — M62830 Muscle spasm of back: Secondary | ICD-10-CM | POA: Diagnosis not present

## 2021-10-17 DIAGNOSIS — M62830 Muscle spasm of back: Secondary | ICD-10-CM | POA: Diagnosis not present

## 2021-10-17 DIAGNOSIS — M5451 Vertebrogenic low back pain: Secondary | ICD-10-CM | POA: Diagnosis not present

## 2021-10-17 DIAGNOSIS — M25551 Pain in right hip: Secondary | ICD-10-CM | POA: Diagnosis not present

## 2021-10-24 DIAGNOSIS — I10 Essential (primary) hypertension: Secondary | ICD-10-CM | POA: Diagnosis not present

## 2021-10-24 DIAGNOSIS — E785 Hyperlipidemia, unspecified: Secondary | ICD-10-CM | POA: Diagnosis not present

## 2021-10-24 DIAGNOSIS — D649 Anemia, unspecified: Secondary | ICD-10-CM | POA: Diagnosis not present

## 2021-10-24 DIAGNOSIS — R7301 Impaired fasting glucose: Secondary | ICD-10-CM | POA: Diagnosis not present

## 2021-10-30 DIAGNOSIS — Z0000 Encounter for general adult medical examination without abnormal findings: Secondary | ICD-10-CM | POA: Diagnosis not present

## 2021-10-30 DIAGNOSIS — I10 Essential (primary) hypertension: Secondary | ICD-10-CM | POA: Diagnosis not present

## 2021-10-30 DIAGNOSIS — E785 Hyperlipidemia, unspecified: Secondary | ICD-10-CM | POA: Diagnosis not present

## 2021-10-30 DIAGNOSIS — R82998 Other abnormal findings in urine: Secondary | ICD-10-CM | POA: Diagnosis not present

## 2021-10-30 DIAGNOSIS — R7301 Impaired fasting glucose: Secondary | ICD-10-CM | POA: Diagnosis not present

## 2021-11-07 DIAGNOSIS — M62830 Muscle spasm of back: Secondary | ICD-10-CM | POA: Diagnosis not present

## 2021-11-07 DIAGNOSIS — M25551 Pain in right hip: Secondary | ICD-10-CM | POA: Diagnosis not present

## 2021-11-07 DIAGNOSIS — M5451 Vertebrogenic low back pain: Secondary | ICD-10-CM | POA: Diagnosis not present

## 2021-12-03 DIAGNOSIS — M545 Low back pain, unspecified: Secondary | ICD-10-CM | POA: Diagnosis not present

## 2021-12-03 DIAGNOSIS — M9904 Segmental and somatic dysfunction of sacral region: Secondary | ICD-10-CM | POA: Diagnosis not present

## 2021-12-03 DIAGNOSIS — M9903 Segmental and somatic dysfunction of lumbar region: Secondary | ICD-10-CM | POA: Diagnosis not present

## 2021-12-03 DIAGNOSIS — M25551 Pain in right hip: Secondary | ICD-10-CM | POA: Diagnosis not present

## 2021-12-04 ENCOUNTER — Ambulatory Visit (INDEPENDENT_AMBULATORY_CARE_PROVIDER_SITE_OTHER): Payer: Medicare Other

## 2021-12-04 ENCOUNTER — Ambulatory Visit: Payer: Medicare Other | Admitting: Podiatry

## 2021-12-04 ENCOUNTER — Other Ambulatory Visit: Payer: Self-pay | Admitting: Podiatry

## 2021-12-04 DIAGNOSIS — G629 Polyneuropathy, unspecified: Secondary | ICD-10-CM | POA: Diagnosis not present

## 2021-12-04 DIAGNOSIS — S90222A Contusion of left lesser toe(s) with damage to nail, initial encounter: Secondary | ICD-10-CM | POA: Diagnosis not present

## 2021-12-04 DIAGNOSIS — S90221A Contusion of right lesser toe(s) with damage to nail, initial encounter: Secondary | ICD-10-CM | POA: Diagnosis not present

## 2021-12-04 DIAGNOSIS — L97522 Non-pressure chronic ulcer of other part of left foot with fat layer exposed: Secondary | ICD-10-CM

## 2021-12-04 DIAGNOSIS — L97429 Non-pressure chronic ulcer of left heel and midfoot with unspecified severity: Secondary | ICD-10-CM

## 2021-12-04 MED ORDER — AMOXICILLIN-POT CLAVULANATE 875-125 MG PO TABS: 1.0000 | ORAL_TABLET | Freq: Two times a day (BID) | ORAL | 0 refills | Status: DC

## 2021-12-04 NOTE — Progress Notes (Signed)
Subjective:   Patient ID: Zachary Gallegos, male   DOB: 71 y.o.   MRN: [id number]   HPI Chief Complaint  Patient presents with   Foot Ulcer    Patient came in today with left foot ulcer on the forefoot, which started year ago, patient is having some drainage and bleeding, rate of pain is a 2 out of 10, Numbness in both feet, there is also a discolored hallux on the left toe nail, X-rays taken today    71 year old male presents with the above concerns.  He states that he typically gets a thick callus on his left foot that comes off and he points to submetatarsal 1.  He states that recently he has had a callus and this is not going away this time.  He is notices minimal amount of drainage when he puts a Band-Aid on the area.  This is an ongoing for several months.  He has not had any recent treatment.  He is an avid walker and is on his foot a lot.  He does go barefoot around the house as well.  He currently denies any fevers or chills.  He states the nails are dark as he wear shoes that were not fitting correctly and the nails turn dark afterwards.  This is on the big toenails.  No pain and he states he is not worried about this.  No drainage.  States that he has neuropathy but they have not been able to identify why he has developed this.   Review of Systems  All other systems reviewed and are negative.  Past Medical History:  Diagnosis Date   Bladder neck contracture    Complication of anesthesia    slow to awaken after dec  2021 rezum  procedure in dr pace office   History of fatty infiltration of liver    monitored for 3 years per pt and told it was cyst per pt on 11-10-2020   History of sleep apnea    uvula removed fro sleep apnea   Hyperlipidemia    Hypertension    Near syncope    with extensive cardiac evaluation- nuc stress '10 none since per pt   Neuromuscular disorder ([place])    neuropathy - feet- non diabetic    Thalassemia trait    minor    Wears glasses 11/10/2020     Past Surgical History:  Procedure Laterality Date   ACHILLES TENDON REPAIR Right    8 yrs ago per pt on 11-10-2020   BASAL CELL CARCINOMA EXCISION  03/2013   face   COLONOSCOPY  04/10/2006   Olevia Perches    colonscopy     few yrs ago per pt on 11-10-2020   CYSTOSCOPY N/A 11/16/2020   Procedure: CYSTOSCOPY;  Surgeon: Robley Fries, MD;  Location: [facility];  Service: Urology;  Laterality: N/A;   rezum procedure  02/2020   in dr pace office with nitrous oxide   TRANSURETHRAL INCISION OF BLADDER NECK N/A 11/16/2020   Procedure: TRANSURETHRAL INCISION OF BLADDER NECK;  Surgeon: Robley Fries, MD;  Location[facility];  Service: Urology;  Laterality: N/A;  1 HR   TRANSURETHRAL RESECTION OF PROSTATE N/A 03/15/2020   Procedure: TRANSURETHRAL RESECTION OF THE PROSTATE (TURP) CLOT EVACUATION;  Surgeon: Robley Fries, MD;  Location: [facility];  Service: Urology;  Laterality: N/A;   TRANSURETHRAL RESECTION OF PROSTATE N/A 11/16/2020   Procedure: TRANSURETHRAL RESECTION OF THE PROSTATE (TURP) REGROWTH;  Surgeon: Robley Fries,  MD;  Location: [facility];  Service: Urology;  Laterality: N/A;   UVULOPALATOPHARYNGOPLASTY, TONSILLECTOMY AND SEPTOPLASTY     several yrs ago per pt on 11-10-2020     Current Outpatient Medications:    amoxicillin-clavulanate (AUGMENTIN) 875-125 MG tablet, Take 1 tablet by mouth 2 (two) times daily., Disp: 20 tablet, Rfl: 0   Ascorbic Acid (VITAMIN C) 1000 MG tablet, Take 1,000 mg by mouth daily., Disp: , Rfl:    aspirin EC 81 MG tablet, Take 81 mg by mouth daily. Swallow whole., Disp: , Rfl:    B COMPLEX-C PO, Take 1 tablet by mouth daily., Disp: , Rfl:    CINNAMON PO, Take by mouth daily., Disp: , Rfl:    Coenzyme Q10 50 MG CAPS, Take 1 capsule by mouth at bedtime., Disp: , Rfl:    Evolocumab 140 MG/ML SOAJ, Inject into the skin every 14 (fourteen) days., Disp: , Rfl:    Magnesium Oxide (MAG-200 PO), Take 1 tablet by  mouth daily., Disp: , Rfl:    olmesartan (BENICAR) 20 MG tablet, Take 20 mg by mouth every evening., Disp: , Rfl:    Omega-3 Fatty Acids (FISH OIL) 1000 MG CAPS, Take 3 capsules by mouth daily., Disp: , Rfl:    OVER THE COUNTER MEDICATION, Take 1 tablet by mouth daily. tumeric, Disp: , Rfl:   Allergies  Allergen Reactions   Nitric Oxide [Nitrogen Oxide]     Slow to awaken          Objective:  Physical Exam  General: AAO x3, NAD  Dermatological: Bilateral hallux nails are hypertrophic, dystrophic with dark discoloration.  It appears to have dried blood underneath the toenail.  There is no extensively hyperpigmentation in the surrounding skin.  There are no open lesions.  Hyperkeratotic tissue with dried blood present submetatarsal 1.  Upon debridement there is a small granular wound present with macerated periwound.  The wound measures 0.4 x 0.2 x 0.2 cm without any probing, and or tunneling.  There is minimal surrounding edema there is no erythema or warmth.  There is no fluctuation or crepitation.       Vascular: DP pulse 2/4, PT pulse 1/4.  CRT less than 3 seconds.  Neruologic: Sensation decreased with Semmes Weinstein monofilament plantar forefoot.  Musculoskeletal: Prominent submetatarsal 1 bilaterally.  This is resulted in the callus submetatarsal 1.  No pain on exam although he does have neuropathy.  Muscular strength 5/5 in all groups tested bilateral.  Gait: Unassisted, Nonantalgic.       Assessment:   71 year old male with ulceration left foot, chronic; likely subungual hematoma toenail bilateral hallux     Plan:  -Treatment options discussed including all alternatives, risks, and complications. -Etiology of symptoms were discussed. -X-rays were obtained and reviewed with the patient.  3 of the left foot were obtained.  No evidence of acute fracture.  There is increased soft tissue density along the first metatarsal phalangeal joint.  There is some radiolucencies  noted in the first metatarsal which I am concerned about possible infection. -Sharply debrided the callus revealed underlying ulceration.  Not able to measure the wound prior to debridement after debridement the wound measurements are noted above.  I debrided the wound with a #3 curette scalpel to healthy, granular tissue.  There is no bleeding.  No blood loss.  Cleaned with saline.  Betadine was applied followed by dressing. -I dispensed a surgical shoe with a peg assist to help offload. -Discussed importance of trying to  stay off the foot is much as possible.  He is going on a golf trip next week which I would not advise.  Discussed not going barefoot around the house as well. -Augmentin ordered -Blood work ordered including CBC, BMP, sed rate, CRP.  If abnormal will order MRI or if there is further concern for infection. -ABI ordered. -Hallux tendons appear to have dried blood.  We will monitor this.  It started after wearing tighter shoes.  Trula Slade DPM

## 2021-12-04 NOTE — Patient Instructions (Signed)
I would recommend washing the wound with soap and water, dry well. Apply a small amount of betadine to the wound daily. Change every day.   I have ordered blood work to look for infection and also a circulation test to check blood flow.

## 2021-12-05 LAB — BASIC METABOLIC PANEL
BUN: 20 mg/dL (ref 7–25)
CO2: 26 mmol/L (ref 20–32)
Calcium: 10.1 mg/dL (ref 8.6–10.3)
Chloride: 103 mmol/L (ref 98–110)
Creat: 1.13 mg/dL (ref 0.70–1.28)
Glucose, Bld: 114 mg/dL — ABNORMAL HIGH (ref 65–99)
Potassium: 5 mmol/L (ref 3.5–5.3)
Sodium: 139 mmol/L (ref 135–146)

## 2021-12-05 LAB — CBC WITH DIFFERENTIAL/PLATELET
Absolute Monocytes: 411 cells/uL (ref 200–950)
Basophils Absolute: 62 cells/uL (ref 0–200)
Basophils Relative: 1.2 %
Eosinophils Absolute: 161 cells/uL (ref 15–500)
Eosinophils Relative: 3.1 %
HCT: 38 % — ABNORMAL LOW (ref 38.5–50.0)
Hemoglobin: 11.2 g/dL — ABNORMAL LOW (ref 13.2–17.1)
Lymphs Abs: 1409 cells/uL (ref 850–3900)
MCH: 19.7 pg — ABNORMAL LOW (ref 27.0–33.0)
MCHC: 29.5 g/dL — ABNORMAL LOW (ref 32.0–36.0)
MCV: 66.8 fL — ABNORMAL LOW (ref 80.0–100.0)
MPV: 12 fL (ref 7.5–12.5)
Monocytes Relative: 7.9 %
Neutro Abs: 3156 cells/uL (ref 1500–7800)
Neutrophils Relative %: 60.7 %
Platelets: 285 10*3/uL (ref 140–400)
RBC: 5.69 10*6/uL (ref 4.20–5.80)
RDW: 16.1 % — ABNORMAL HIGH (ref 11.0–15.0)
Total Lymphocyte: 27.1 %
WBC: 5.2 10*3/uL (ref 3.8–10.8)

## 2021-12-05 LAB — CBC MORPHOLOGY

## 2021-12-05 LAB — SEDIMENTATION RATE: Sed Rate: 6 mm/h (ref 0–20)

## 2021-12-05 LAB — C-REACTIVE PROTEIN: CRP: 2.1 mg/L (ref <8.0)

## 2021-12-14 ENCOUNTER — Ambulatory Visit (HOSPITAL_COMMUNITY)
Admission: RE | Admit: 2021-12-14 | Discharge: 2021-12-14 | Disposition: A | Discharge status: Home / Self Care | Payer: Medicare Other | Source: Ambulatory Visit | Attending: Cardiovascular Disease | Admitting: Cardiovascular Disease

## 2021-12-14 DIAGNOSIS — L97522 Non-pressure chronic ulcer of other part of left foot with fat layer exposed: Secondary | ICD-10-CM

## 2021-12-17 ENCOUNTER — Ambulatory Visit: Payer: Medicare Other | Admitting: Podiatry

## 2021-12-17 ENCOUNTER — Ambulatory Visit (INDEPENDENT_AMBULATORY_CARE_PROVIDER_SITE_OTHER): Payer: Medicare Other

## 2021-12-17 DIAGNOSIS — L84 Corns and callosities: Secondary | ICD-10-CM

## 2021-12-17 DIAGNOSIS — L97522 Non-pressure chronic ulcer of other part of left foot with fat layer exposed: Secondary | ICD-10-CM

## 2021-12-17 DIAGNOSIS — M216X2 Other acquired deformities of left foot: Secondary | ICD-10-CM

## 2021-12-20 NOTE — Progress Notes (Signed)
Subjective: Chief Complaint  Patient presents with   Foot Ulcer    Left ulcer, Patient denies any drainage, patient states he is doing better and has less pain,  ABI- done Friday, Blood work- done, TX: Augmentin    71 year old male presents the office today with above concerns.  He states that he has been doing well.  He did play golf without issue.  He shows me his shoes today where he cut out of the area to help take pressure off the lesion on the left foot.  He states he is doing much better.  Denies any drainage or pus.  No fever chills eye reports.  Objective: AAO x3, NAD DP/PT pulses palpable bilaterally, CRT less than 3 seconds Left foot submetatarsal 1 is beginning area of hyperkeratotic tissue with dried blood.  As able to debride this today there is no underlying ulceration noted today.  There is no drainage or pus.  There is no fluctuation or crepitation.  There is no malodor.  Area is still preulcerative.  No clinical signs of infection noted today.  No other open lesions. No pain with calf compression, swelling, warmth, erythema  Assessment: Preulcerative lesion left foot  Plan: -All treatment options discussed with the patient including all alternatives, risks, complications.  -Repeat x-rays were obtained reviewed.  3 views left foot were obtained.  X-rays appear stable compared to prior x-rays.  Erosion noted which could be consistent with gout as well (although he does not have any history of gout that he knows of but he did discuss this previously with another doctor-blood test was performed that time which was negative) -I have reviewed the hyperkeratotic lesion.  The area still preulcerative but there is no clinical signs of infection.  Previous blood work reviewed white count was 5.2, sed rate 6 and CRP 2.1.  Given this not likely for osteomyelitis.  Also clinically the wound is doing better.  We will monitor closely. -I did further evaluate his inserts I added further  padding.  Consider custom inserts if needed as well. -Patient encouraged to call the office with any questions, concerns, change in symptoms.   Trula Slade DPM

## 2022-01-14 ENCOUNTER — Ambulatory Visit: Payer: Medicare Other | Admitting: Podiatry

## 2022-01-21 DIAGNOSIS — R7989 Other specified abnormal findings of blood chemistry: Secondary | ICD-10-CM | POA: Diagnosis not present

## 2022-01-21 DIAGNOSIS — E785 Hyperlipidemia, unspecified: Secondary | ICD-10-CM | POA: Diagnosis not present

## 2022-01-21 DIAGNOSIS — I10 Essential (primary) hypertension: Secondary | ICD-10-CM | POA: Diagnosis not present

## 2022-01-29 IMAGING — CT CT HEAD W/O CM
3 series · 16 of 47 positions shown, 19 images · non-contrast
Comparison: None.

CLINICAL DATA: Syncope, head injury

EXAM:
CT HEAD WITHOUT CONTRAST
TECHNIQUE: Contiguous axial images were obtained from the base of the skull
through the vertex without intravenous contrast.

[Series 2: head wo · axial · 0.47mm/px · z∈[-89,+51]mm · 10 of 34 slices shown, 13 images]
[im 3/34  brain]
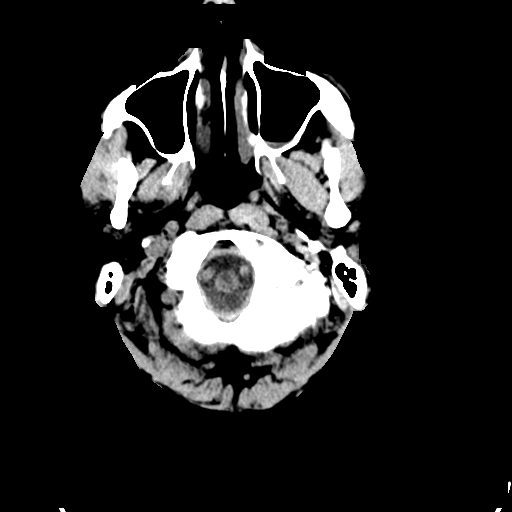
[im 3/34  bone]
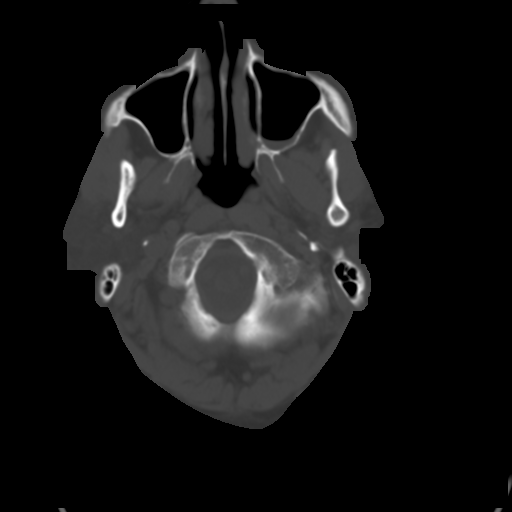
[im 6/34  brain]
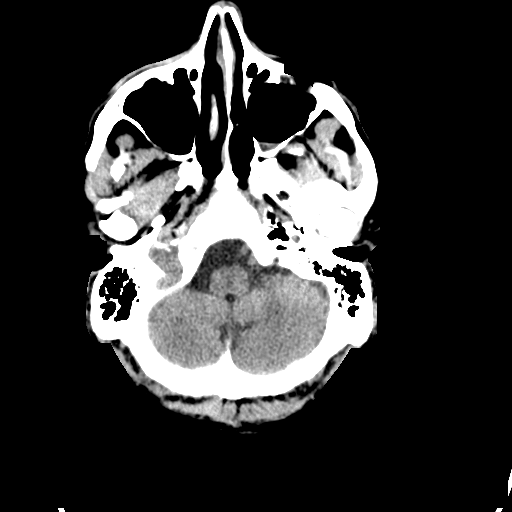
[im 10/34  brain]
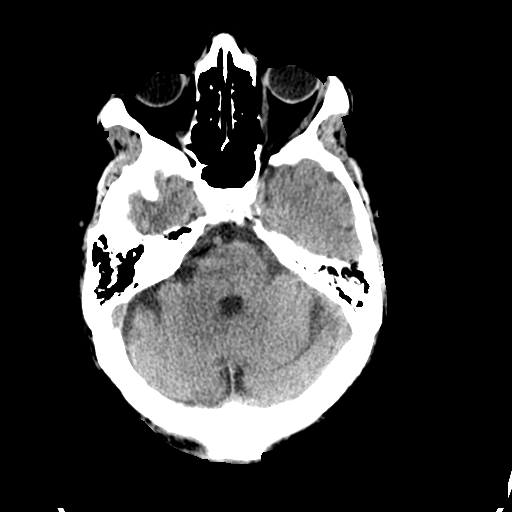
[im 12/34  brain]
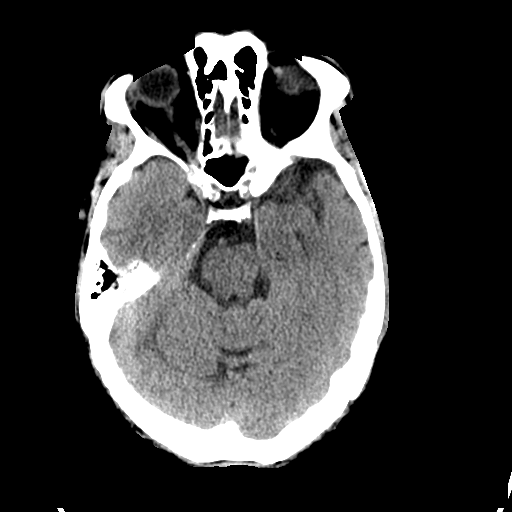
[im 15/34  brain]
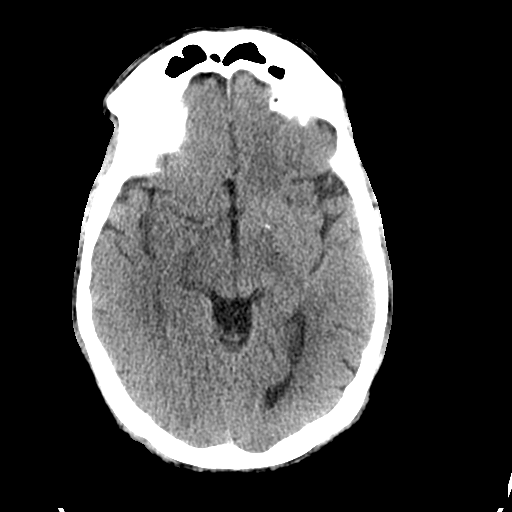
[im 15/34  bone]
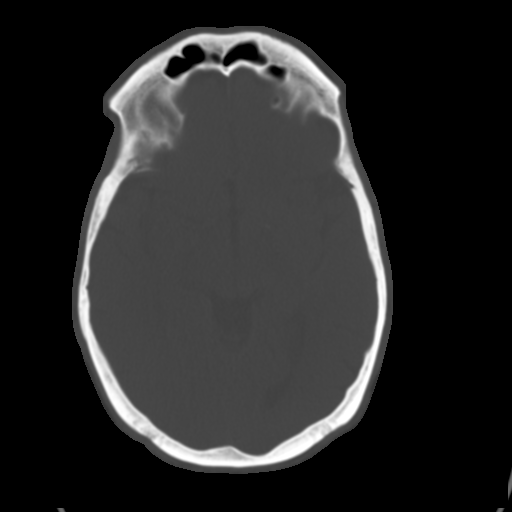
[im 19/34  brain]
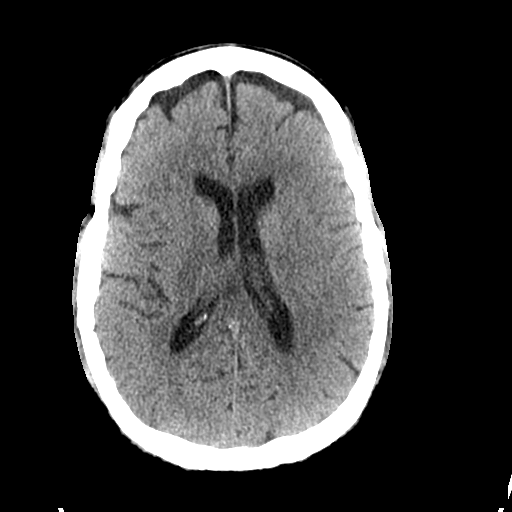
[im 22/34  brain]
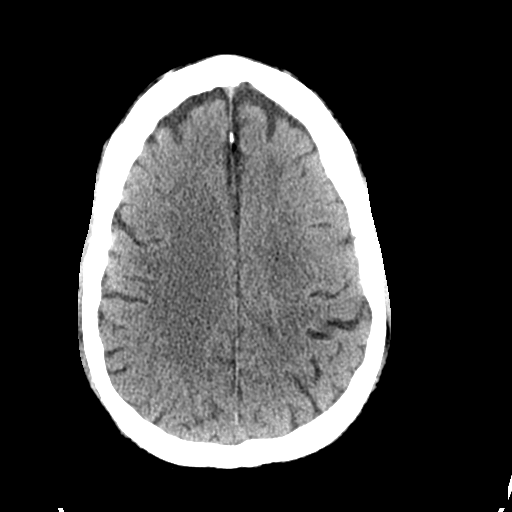
[im 26/34  brain]
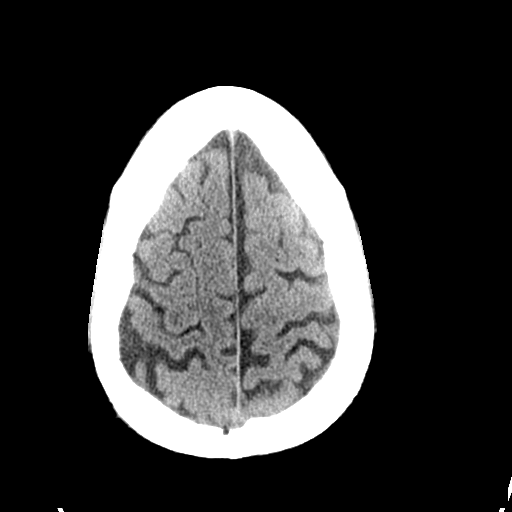
[im 28/34  brain]
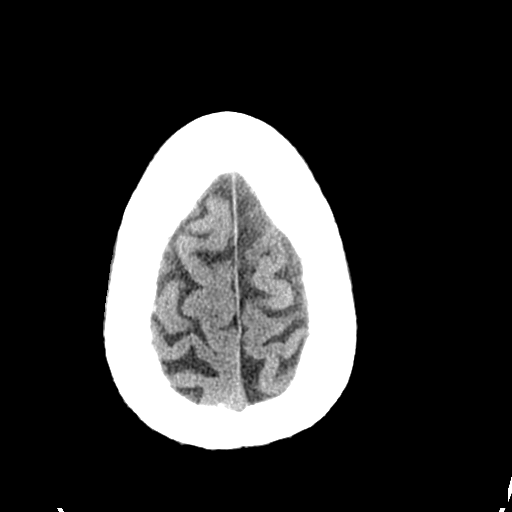
[im 28/34  bone]
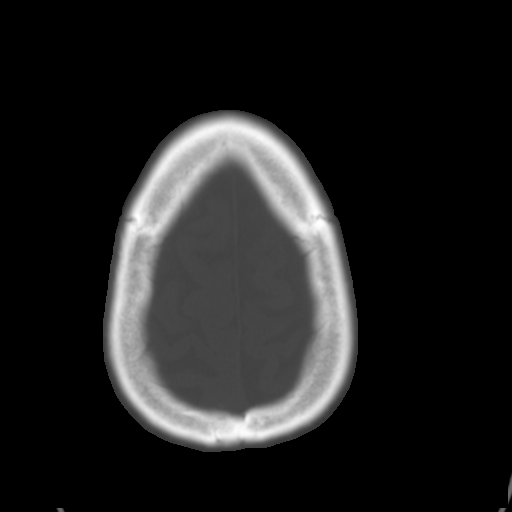
[im 31/34  brain]
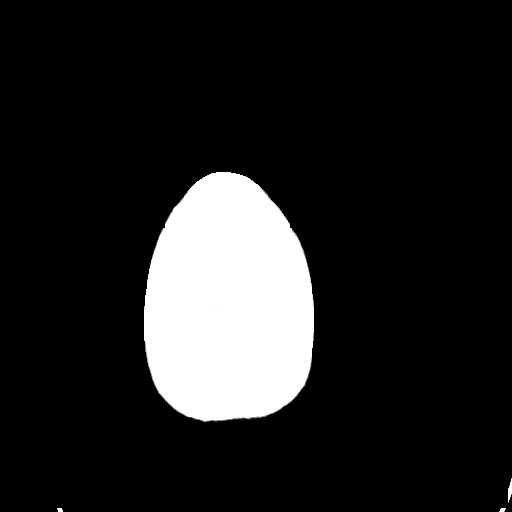

[Series 5: coronal soft tissue · coronal · 0.33mm/px · 3 of 76 slices shown]
[im 29/76  brain]
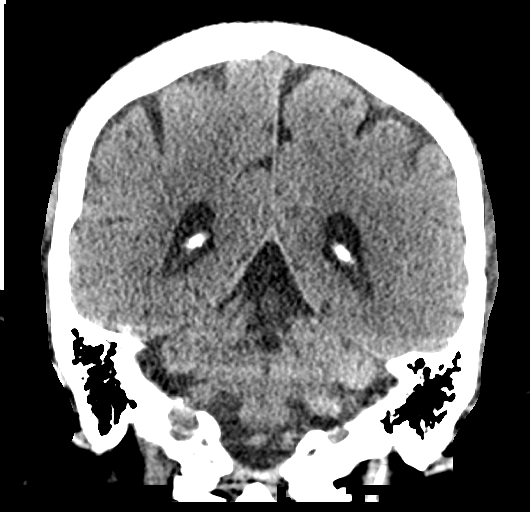
[im 35/76  brain]
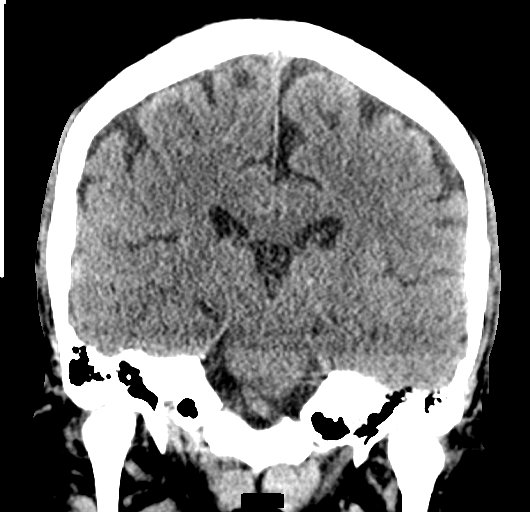
[im 41/76  brain]
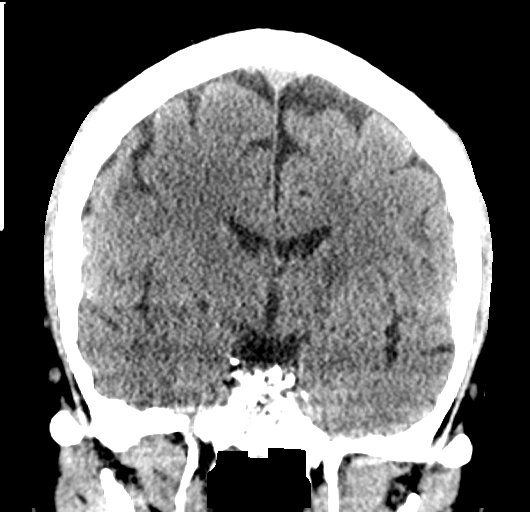

[Series 6: sagittal soft tissue · sagittal · 0.33mm/px · 3 of 59 slices shown]
[im 20/59  brain]
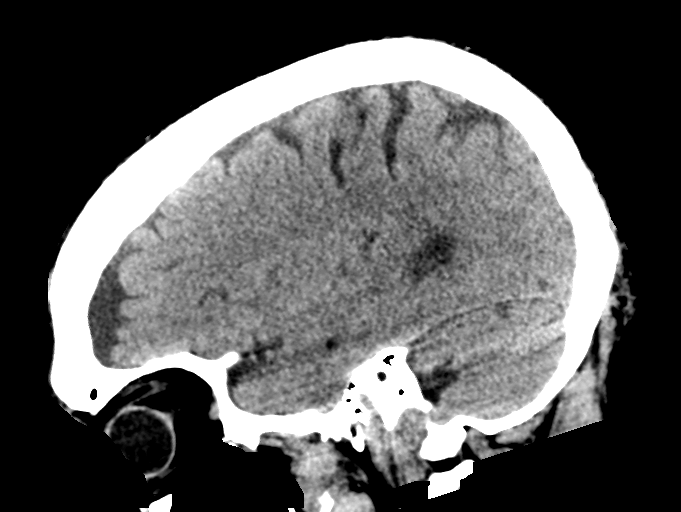
[im 30/59  brain]
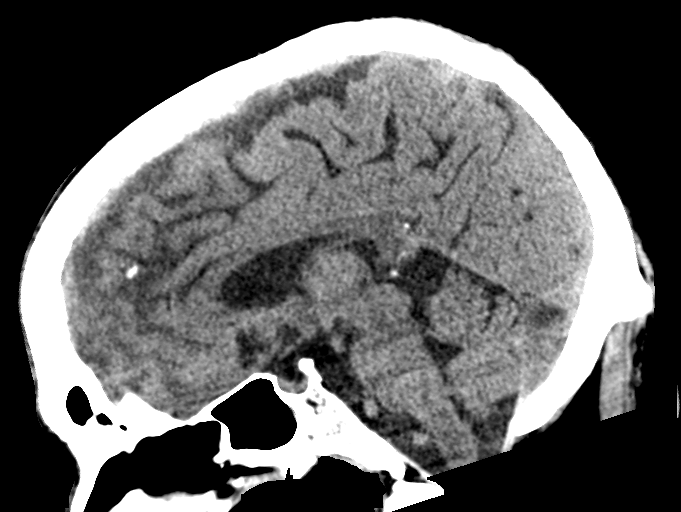
[im 39/59  brain]
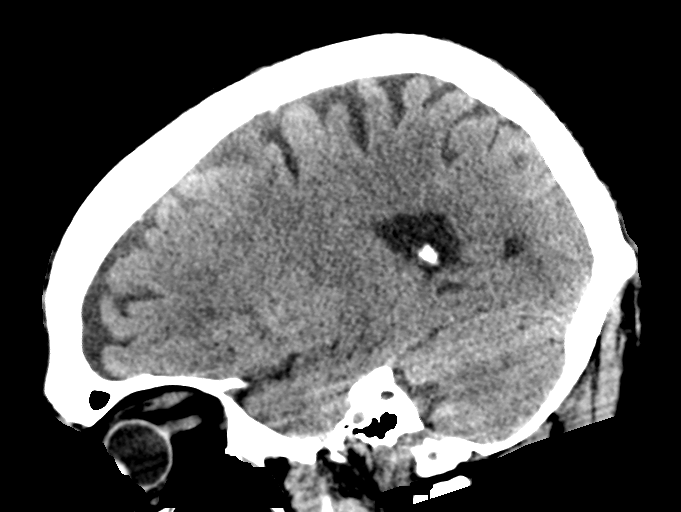

[16 of 47 positions shown; findings below may reference images not displayed]

FINDINGS: Brain: There is no acute intracranial hemorrhage, mass effect, or
edema. Gray-white differentiation is preserved. There is no
extra-axial fluid collection. Ventricles and sulci are within normal
limits in size and configuration.

Vascular: There is atherosclerotic calcification at the skull base.

Skull: Calvarium is unremarkable.

Sinuses/Orbits: Minor mucosal thickening.  Orbits are unremarkable.

Other: None.
IMPRESSION: No evidence of acute intracranial injury.

## 2022-02-04 ENCOUNTER — Ambulatory Visit: Payer: Medicare Other | Admitting: Podiatry

## 2022-02-05 DIAGNOSIS — L821 Other seborrheic keratosis: Secondary | ICD-10-CM | POA: Diagnosis not present

## 2022-02-05 DIAGNOSIS — D225 Melanocytic nevi of trunk: Secondary | ICD-10-CM | POA: Diagnosis not present

## 2022-02-05 DIAGNOSIS — Z85828 Personal history of other malignant neoplasm of skin: Secondary | ICD-10-CM | POA: Diagnosis not present

## 2022-02-05 DIAGNOSIS — L814 Other melanin hyperpigmentation: Secondary | ICD-10-CM | POA: Diagnosis not present

## 2022-02-12 DIAGNOSIS — H35373 Puckering of macula, bilateral: Secondary | ICD-10-CM | POA: Diagnosis not present

## 2022-03-18 ENCOUNTER — Ambulatory Visit: Payer: Medicare Other | Admitting: Podiatry

## 2022-05-21 DIAGNOSIS — I10 Essential (primary) hypertension: Secondary | ICD-10-CM | POA: Diagnosis not present

## 2022-05-21 DIAGNOSIS — E785 Hyperlipidemia, unspecified: Secondary | ICD-10-CM | POA: Diagnosis not present

## 2022-06-05 DIAGNOSIS — M1611 Unilateral primary osteoarthritis, right hip: Secondary | ICD-10-CM | POA: Diagnosis not present

## 2022-06-05 DIAGNOSIS — M25551 Pain in right hip: Secondary | ICD-10-CM | POA: Diagnosis not present

## 2022-06-05 DIAGNOSIS — M545 Low back pain, unspecified: Secondary | ICD-10-CM | POA: Diagnosis not present

## 2022-06-05 DIAGNOSIS — M9906 Segmental and somatic dysfunction of lower extremity: Secondary | ICD-10-CM | POA: Diagnosis not present

## 2022-07-15 DIAGNOSIS — K080 Exfoliation of teeth due to systemic causes: Secondary | ICD-10-CM | POA: Diagnosis not present

## 2022-07-26 DIAGNOSIS — H524 Presbyopia: Secondary | ICD-10-CM | POA: Diagnosis not present

## 2022-07-29 DIAGNOSIS — M9903 Segmental and somatic dysfunction of lumbar region: Secondary | ICD-10-CM | POA: Diagnosis not present

## 2022-07-29 DIAGNOSIS — M1611 Unilateral primary osteoarthritis, right hip: Secondary | ICD-10-CM | POA: Diagnosis not present

## 2022-07-29 DIAGNOSIS — M25551 Pain in right hip: Secondary | ICD-10-CM | POA: Diagnosis not present

## 2022-07-29 DIAGNOSIS — M9906 Segmental and somatic dysfunction of lower extremity: Secondary | ICD-10-CM | POA: Diagnosis not present

## 2022-11-14 DIAGNOSIS — E785 Hyperlipidemia, unspecified: Secondary | ICD-10-CM | POA: Diagnosis not present

## 2022-11-14 DIAGNOSIS — N400 Enlarged prostate without lower urinary tract symptoms: Secondary | ICD-10-CM | POA: Diagnosis not present

## 2022-11-14 DIAGNOSIS — R7301 Impaired fasting glucose: Secondary | ICD-10-CM | POA: Diagnosis not present

## 2022-11-26 DIAGNOSIS — R82998 Other abnormal findings in urine: Secondary | ICD-10-CM | POA: Diagnosis not present

## 2022-11-26 DIAGNOSIS — I10 Essential (primary) hypertension: Secondary | ICD-10-CM | POA: Diagnosis not present

## 2022-11-26 DIAGNOSIS — Z23 Encounter for immunization: Secondary | ICD-10-CM | POA: Diagnosis not present

## 2022-11-26 DIAGNOSIS — Z1331 Encounter for screening for depression: Secondary | ICD-10-CM | POA: Diagnosis not present

## 2022-11-26 DIAGNOSIS — Z0000 Encounter for general adult medical examination without abnormal findings: Secondary | ICD-10-CM | POA: Diagnosis not present

## 2022-11-26 DIAGNOSIS — Z1339 Encounter for screening examination for other mental health and behavioral disorders: Secondary | ICD-10-CM | POA: Diagnosis not present

## 2023-01-28 DIAGNOSIS — K080 Exfoliation of teeth due to systemic causes: Secondary | ICD-10-CM | POA: Diagnosis not present

## 2023-03-17 DIAGNOSIS — H35373 Puckering of macula, bilateral: Secondary | ICD-10-CM | POA: Diagnosis not present

## 2023-04-23 DIAGNOSIS — K080 Exfoliation of teeth due to systemic causes: Secondary | ICD-10-CM | POA: Diagnosis not present

## 2023-06-04 IMAGING — CR DG HIP (WITH OR WITHOUT PELVIS) 2-3V*R*
2 series · 2 of 2 positions shown · non-contrast
Comparison: None Available.

CLINICAL DATA: Right hip pain.

EXAM:
DG HIP (WITH OR WITHOUT PELVIS) 2-3V RIGHT

[w hip ap right]
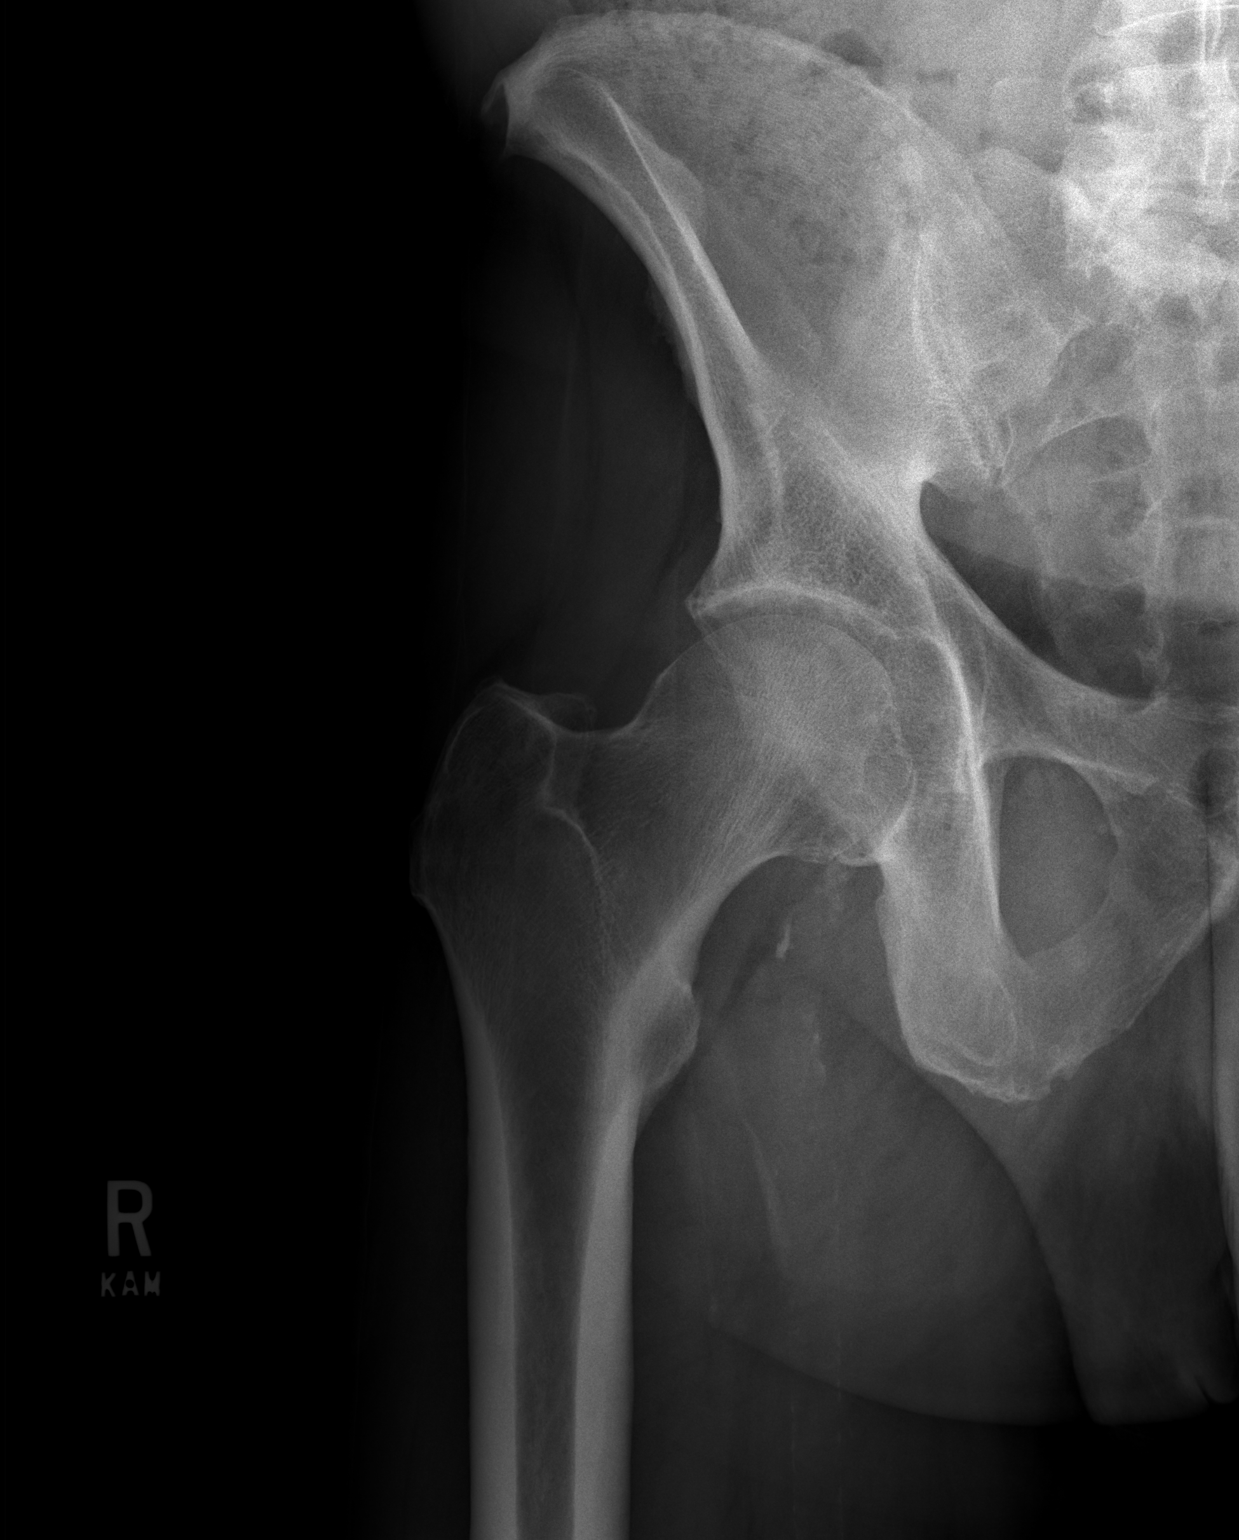

[w hip frog right]
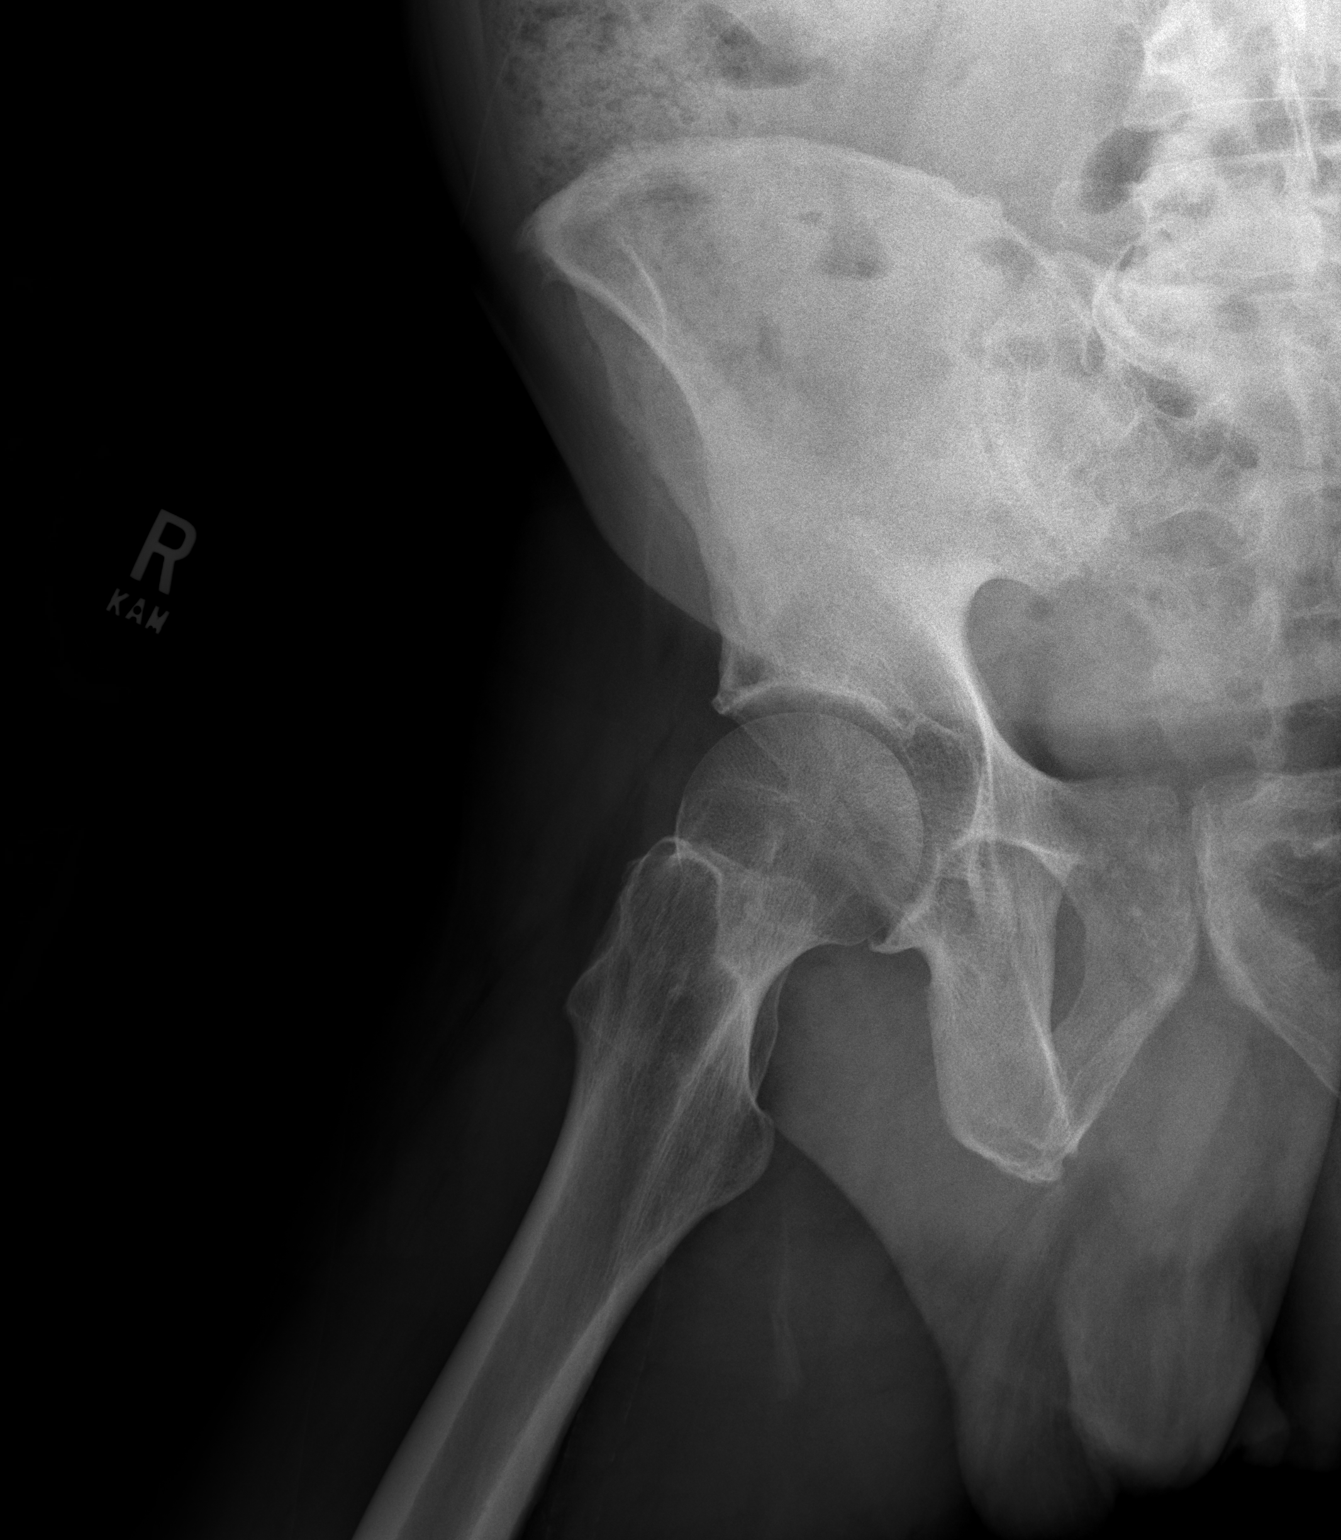

[2 of 2 positions shown; findings below may reference images not displayed]

FINDINGS: There is no evidence of hip fracture or dislocation. Mild to
moderate severity degenerative changes are seen in the form of joint
space narrowing and acetabular sclerosis.
IMPRESSION: Degenerative changes without an acute osseous abnormality.

## 2023-08-14 DIAGNOSIS — K080 Exfoliation of teeth due to systemic causes: Secondary | ICD-10-CM | POA: Diagnosis not present

## 2023-08-15 DIAGNOSIS — U071 COVID-19: Secondary | ICD-10-CM | POA: Diagnosis not present

## 2023-08-15 DIAGNOSIS — J069 Acute upper respiratory infection, unspecified: Secondary | ICD-10-CM | POA: Diagnosis not present

## 2023-08-15 DIAGNOSIS — R0981 Nasal congestion: Secondary | ICD-10-CM | POA: Diagnosis not present

## 2023-10-01 DIAGNOSIS — I10 Essential (primary) hypertension: Secondary | ICD-10-CM | POA: Diagnosis not present

## 2023-11-18 DIAGNOSIS — D225 Melanocytic nevi of trunk: Secondary | ICD-10-CM | POA: Diagnosis not present

## 2023-11-18 DIAGNOSIS — Z85828 Personal history of other malignant neoplasm of skin: Secondary | ICD-10-CM | POA: Diagnosis not present

## 2023-11-18 DIAGNOSIS — L820 Inflamed seborrheic keratosis: Secondary | ICD-10-CM | POA: Diagnosis not present

## 2023-11-18 DIAGNOSIS — D485 Neoplasm of uncertain behavior of skin: Secondary | ICD-10-CM | POA: Diagnosis not present

## 2023-11-18 DIAGNOSIS — L821 Other seborrheic keratosis: Secondary | ICD-10-CM | POA: Diagnosis not present

## 2023-11-18 DIAGNOSIS — L814 Other melanin hyperpigmentation: Secondary | ICD-10-CM | POA: Diagnosis not present

## 2023-12-15 DIAGNOSIS — Z125 Encounter for screening for malignant neoplasm of prostate: Secondary | ICD-10-CM | POA: Diagnosis not present

## 2023-12-15 DIAGNOSIS — R7301 Impaired fasting glucose: Secondary | ICD-10-CM | POA: Diagnosis not present

## 2023-12-15 DIAGNOSIS — Z0189 Encounter for other specified special examinations: Secondary | ICD-10-CM | POA: Diagnosis not present

## 2023-12-15 DIAGNOSIS — E785 Hyperlipidemia, unspecified: Secondary | ICD-10-CM | POA: Diagnosis not present

## 2023-12-18 DIAGNOSIS — K080 Exfoliation of teeth due to systemic causes: Secondary | ICD-10-CM | POA: Diagnosis not present

## 2023-12-22 DIAGNOSIS — R82998 Other abnormal findings in urine: Secondary | ICD-10-CM | POA: Diagnosis not present

## 2023-12-22 DIAGNOSIS — I10 Essential (primary) hypertension: Secondary | ICD-10-CM | POA: Diagnosis not present

## 2023-12-22 DIAGNOSIS — Z0000 Encounter for general adult medical examination without abnormal findings: Secondary | ICD-10-CM | POA: Diagnosis not present

## 2023-12-22 DIAGNOSIS — Z1331 Encounter for screening for depression: Secondary | ICD-10-CM | POA: Diagnosis not present

## 2023-12-22 DIAGNOSIS — Z1339 Encounter for screening examination for other mental health and behavioral disorders: Secondary | ICD-10-CM | POA: Diagnosis not present

## 2024-01-12 DIAGNOSIS — K080 Exfoliation of teeth due to systemic causes: Secondary | ICD-10-CM | POA: Diagnosis not present

## 2024-01-29 NOTE — Progress Notes (Signed)
 Zachary Gallegos                                          MRN: [id number]   01/29/2024   The [facility] Quality Team Specialist reviewed this patient medical record for the purposes of chart review for care gap closure. The following were reviewed: chart review for care gap closure-controlling blood pressure.    [facility] Quality Team

## 2024-02-09 ENCOUNTER — Encounter: Payer: Self-pay | Admitting: Cardiovascular Disease

## 2024-02-09 ENCOUNTER — Ambulatory Visit: Attending: Cardiovascular Disease | Admitting: Cardiovascular Disease

## 2024-02-09 VITALS — BP 132/78 | HR 74 | Ht 73.0 in | Wt 200.6 lb

## 2024-02-09 DIAGNOSIS — I739 Peripheral vascular disease, unspecified: Secondary | ICD-10-CM

## 2024-02-09 DIAGNOSIS — R931 Abnormal findings on diagnostic imaging of heart and coronary circulation: Secondary | ICD-10-CM

## 2024-02-09 DIAGNOSIS — E782 Mixed hyperlipidemia: Secondary | ICD-10-CM

## 2024-02-09 DIAGNOSIS — I10 Essential (primary) hypertension: Secondary | ICD-10-CM | POA: Diagnosis not present

## 2024-02-09 NOTE — Assessment & Plan Note (Signed)
 History of elevated coronary calcium score of 653 performed 05/16/2017 with calcium and all of his coronary arteries including RCA, LAD and left main.  He is completely asymptomatic and very active.  He did have a Myoview  stress test performed 07/10/2017 which was nonischemic and low risk.  Based on this we decided to aggressively treat his first risk factors including his lipids and blood pressure.

## 2024-02-09 NOTE — Patient Instructions (Signed)

## 2024-02-09 NOTE — Assessment & Plan Note (Signed)
 History of essential hypertension blood pressure measured today at 132/78.  He is on olmesartan.  Apparently he contracted COVID in May of this year while being on a cruise and after that time his blood pressure has been somewhat labile although over time it is becoming more easily manageable.

## 2024-02-09 NOTE — Progress Notes (Signed)
 02/09/2024 Zachary Gallegos   [birth year]  [id number]  Primary Physician Loreli Elsie JONETTA Mickey., MD Primary Cardiologist: Dorn JINNY Lesches MD [facility] [place] [place], [place]  HPI:  Zachary Gallegos is a 73 y.o.   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 Loreli for cardiovascular evaluation because of a high coronary calcium score of 653 measured on 05/16/17 with calcium in the distribution of the RCA, LAD and left main. His cardiac risk factor profile is notable for hyperlipidemia intolerant to statin therapy currently on Repatha followed by his PCP. History of hypertension and family history of the father who died of a myocardial infarction at age 71. He has never had a heart attack or stroke and denies chest pain or shortness of breath. He exercises daily on the elliptical, playing golf and walking.  Since I saw him 6 years ago his remained stable.  He continues to be very active and exercises on a daily basis.  He did contract COVID after coming back from a cruise 5/25 and after that had labile hypertension which is somewhat improved.  He denies chest pain or shortness of breath.   No outpatient medications have been marked as taking for the 02/09/24 encounter (Office Visit) with Lesches Dorn JINNY, MD.     Allergies  Allergen Reactions   Nitric Oxide [Nitrogen Oxide]     Slow to awaken    Social History   Socioeconomic History   Marital status: Married    Spouse name: Not on file   Number of children: 2   Years of education: 18   Highest education level: Not on file  Occupational History   Occupation: testile executive    Comment: self-employed  Tobacco Use   Smoking status: Never   Smokeless tobacco: Never  Vaping Use   Vaping status: Never Used  Substance and Sexual Activity   Alcohol  use: Yes    Alcohol /week: 5.0  standard drinks of alcohol     Types: 5 Standard drinks or equivalent per week   Drug use: No   Sexual activity: Yes    Partners: Female  Other Topics Concern   Not on file  Social History Narrative   Zachary Gallegos [address]- BS Business; [place]. Married '75. 1 son '89; 2 daughters '81, '84. Work: self empl. - textile mfg. Got a great price on a '99 mercedes sl 500 convertaible (june '11). Mother in [place] recently died ('46). Family is making appropriate adjustment.   Regular exercise: daily   Caffeine use: coffee daily         Social Drivers of Corporate Investment Banker Strain: Not on file  Food Insecurity: Not on file  Transportation Needs: Not on file  Physical Activity: Not on file  Stress: Not on file  Social Connections: Not on file  Intimate Partner Violence: Not on file     Review of Systems: General: negative for chills, fever, night sweats or weight changes.  Cardiovascular: negative for chest pain, dyspnea on exertion, edema, orthopnea, palpitations, paroxysmal nocturnal dyspnea or shortness of breath Dermatological: negative for rash Respiratory: negative for cough or wheezing Urologic: negative for hematuria Abdominal: negative for nausea, vomiting, diarrhea, bright red blood per rectum, melena, or hematemesis Neurologic: negative for visual changes, syncope, or dizziness All other systems reviewed and are otherwise negative except as noted above.  Blood pressure 132/78, pulse 74, height 6' 1 (1.854 m), weight 200 lb 9.6 oz (91 kg), SpO2 98%.  General appearance: alert and no distress Neck: no adenopathy, no carotid bruit, no JVD, supple, symmetrical, trachea midline, and thyroid  not enlarged, symmetric, no tenderness/mass/nodules Lungs: clear to auscultation bilaterally Heart: regular rate and rhythm, S1, S2 normal, no murmur, click, rub or gallop Extremities: extremities normal, atraumatic, no cyanosis or edema Pulses: 2+ and symmetric Skin: Skin color,  texture, turgor normal. No rashes or lesions Neurologic: Grossly normal  EKG EKG Interpretation Date/Time:  Monday February 09 2024 13:30:49 EST Ventricular Rate:  74 PR Interval:  182 QRS Duration:  94 QT Interval:  388 QTC Calculation: 430 R Axis:   -47  Text Interpretation: Normal sinus rhythm Left anterior fascicular block When compared with ECG of 14-Mar-2020 20:05, PREVIOUS ECG IS PRESENT Confirmed by Court Carrier [phone]) on 02/09/2024 1:51:22 PM    ASSESSMENT AND PLAN:   Hyperlipidemia History of hyperlipidemia intolerant to statin therapy on Repatha with lipid profile performed by his PCP 12/15/2023 revealing total cholesterol 139, LDL 26 and HDL of 49.  His triglyceride level was elevated at 320 but he does admit to dietary indiscretion around that time while he was on vacation.  Essential hypertension History of essential hypertension blood pressure measured today at 132/78.  He is on olmesartan.  Apparently he contracted COVID in May of this year while being on a cruise and after that time his blood pressure has been somewhat labile although over time it is becoming more easily manageable.  Agatston coronary artery calcium score greater than 400 History of elevated coronary calcium score of 653 performed 05/16/2017 with calcium and all of his coronary arteries including RCA, LAD and left main.  He is completely asymptomatic and very active.  He did have a Myoview  stress test performed 07/10/2017 which was nonischemic and low risk.  Based on this we decided to aggressively treat his first risk factors including his lipids and blood pressure.     Carrier DOROTHA Court MD FACP,FACC,FAHA, [facility] 02/09/2024 2:03 PM

## 2024-02-09 NOTE — Assessment & Plan Note (Signed)
 History of hyperlipidemia intolerant to statin therapy on Repatha with lipid profile performed by his PCP 12/15/2023 revealing total cholesterol 139, LDL 26 and HDL of 49.  His triglyceride level was elevated at 320 but he does admit to dietary indiscretion around that time while he was on vacation.

## 2024-02-19 DIAGNOSIS — K080 Exfoliation of teeth due to systemic causes: Secondary | ICD-10-CM | POA: Diagnosis not present
